# Patient Record
Sex: Female | Born: 1965 | Race: White | Hispanic: No | Marital: Married | Smoking: Never smoker
Health system: Southern US, Community
[De-identification: ages and names within clinical notes are randomized; demographics above are authoritative.]

## PROBLEM LIST (undated history)

## (undated) DIAGNOSIS — M5030 Other cervical disc degeneration, unspecified cervical region: Secondary | ICD-10-CM

## (undated) DIAGNOSIS — Z973 Presence of spectacles and contact lenses: Secondary | ICD-10-CM

## (undated) DIAGNOSIS — Z8742 Personal history of other diseases of the female genital tract: Secondary | ICD-10-CM

## (undated) DIAGNOSIS — T8859XA Other complications of anesthesia, initial encounter: Secondary | ICD-10-CM

## (undated) DIAGNOSIS — D649 Anemia, unspecified: Secondary | ICD-10-CM

## (undated) DIAGNOSIS — T4145XA Adverse effect of unspecified anesthetic, initial encounter: Secondary | ICD-10-CM

## (undated) DIAGNOSIS — F329 Major depressive disorder, single episode, unspecified: Secondary | ICD-10-CM

## (undated) DIAGNOSIS — R102 Pelvic and perineal pain: Secondary | ICD-10-CM

## (undated) DIAGNOSIS — G8929 Other chronic pain: Secondary | ICD-10-CM

## (undated) DIAGNOSIS — F32A Depression, unspecified: Secondary | ICD-10-CM

## (undated) DIAGNOSIS — M542 Cervicalgia: Secondary | ICD-10-CM

## (undated) HISTORY — PX: POSTERIOR LUMBAR FUSION: SHX6036

---
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
## 1989-07-28 HISTORY — PX: TONSILLECTOMY: SUR1361

## 1994-07-28 HISTORY — PX: VAGINAL HYSTERECTOMY: SUR661

## 2000-07-28 HISTORY — PX: ANTERIOR CERVICAL DECOMP/DISCECTOMY FUSION: SHX1161

## 2000-10-14 ENCOUNTER — Inpatient Hospital Stay (HOSPITAL_COMMUNITY): Admission: RE | Admit: 2000-10-14 | Discharge: 2000-10-15 | Payer: Self-pay | Admitting: Neurosurgery

## 2000-10-14 ENCOUNTER — Encounter: Payer: Self-pay | Admitting: Neurosurgery

## 2000-11-09 ENCOUNTER — Encounter: Admission: RE | Admit: 2000-11-09 | Discharge: 2000-11-09 | Payer: Self-pay | Admitting: Neurosurgery

## 2000-11-09 ENCOUNTER — Encounter: Payer: Self-pay | Admitting: Neurosurgery

## 2005-07-28 HISTORY — PX: COLONOSCOPY: SHX174

## 2005-09-05 ENCOUNTER — Ambulatory Visit: Payer: Self-pay | Admitting: Gastroenterology

## 2005-09-05 ENCOUNTER — Ambulatory Visit: Payer: Self-pay | Admitting: Internal Medicine

## 2005-09-09 ENCOUNTER — Ambulatory Visit: Payer: Self-pay | Admitting: Gastroenterology

## 2005-09-12 ENCOUNTER — Encounter: Payer: Self-pay | Admitting: Gastroenterology

## 2005-09-12 ENCOUNTER — Ambulatory Visit: Payer: Self-pay | Admitting: Gastroenterology

## 2005-10-10 ENCOUNTER — Ambulatory Visit: Payer: Self-pay | Admitting: Gastroenterology

## 2005-11-11 ENCOUNTER — Ambulatory Visit: Payer: Self-pay | Admitting: Gastroenterology

## 2010-04-23 ENCOUNTER — Encounter: Payer: Self-pay | Admitting: Gastroenterology

## 2010-04-26 ENCOUNTER — Encounter: Payer: Self-pay | Admitting: Gastroenterology

## 2010-05-08 ENCOUNTER — Encounter: Payer: Self-pay | Admitting: Gastroenterology

## 2010-05-14 ENCOUNTER — Encounter: Payer: Self-pay | Admitting: Gastroenterology

## 2010-08-30 ENCOUNTER — Encounter: Payer: Self-pay | Admitting: Gastroenterology

## 2010-09-02 ENCOUNTER — Encounter (INDEPENDENT_AMBULATORY_CARE_PROVIDER_SITE_OTHER): Payer: Self-pay | Admitting: *Deleted

## 2010-09-12 NOTE — Letter (Deleted)
Summary: New Patient letter  [facility]  [address]   [place], [place] [address]   Phone: [phone]  Fax: [phone]       09/02/2010 MRN: [id number]  [name] [name] [address] [place], [place]  [address]  Dear Ms. [name] [name],  Welcome to the Gastroenterology Division at [facility].    You are scheduled to see Dr.  PATTERSON on 10-01-10 at 8:30A.M. on the 3rd floor at [facility], [address].  We ask that you try to arrive at our office 15 minutes prior to your appointment time to allow for check-in.  We would like you to complete the enclosed self-administered evaluation form prior to your visit and bring it with you on the day of your appointment.  We will review it with you.  Also, please bring a complete list of all your medications or, if you prefer, bring the medication bottles and we will list them.  Please bring your insurance card so that we may make a copy of it.  If your insurance requires a referral to see a specialist, please bring your referral form from your primary care physician.  Co-payments are due at the time of your visit and may be paid by cash, check or credit card.     Your office visit will consist of a consult with your physician (includes a physical exam), any laboratory testing he/she may order, scheduling of any necessary diagnostic testing (e.g. x-ray, ultrasound, CT-scan), and scheduling of a procedure (e.g. Endoscopy, Colonoscopy) if required.  Please allow enough time on your schedule to allow for any/all of these possibilities.    If you cannot keep your appointment, please call [phone] to cancel or reschedule prior to your appointment date.  This allows us the opportunity to schedule an appointment for another patient in need of care.  If you do not cancel or reschedule by 5 p.m. the business day prior to your appointment date, you will be charged a $50.00 late cancellation/no-show fee.    Thank you for  choosing [facility] for your medical needs.  We appreciate the opportunity to care for you.  Please visit us at our website  to learn more about our practice.                     Sincerely,                                                             The Gastroenterology Division  

## 2010-09-30 DIAGNOSIS — Z8542 Personal history of malignant neoplasm of other parts of uterus: Secondary | ICD-10-CM | POA: Insufficient documentation

## 2010-09-30 DIAGNOSIS — Z8541 Personal history of malignant neoplasm of cervix uteri: Secondary | ICD-10-CM | POA: Insufficient documentation

## 2010-10-01 ENCOUNTER — Encounter: Payer: Self-pay | Admitting: Gastroenterology

## 2010-10-01 ENCOUNTER — Ambulatory Visit (INDEPENDENT_AMBULATORY_CARE_PROVIDER_SITE_OTHER): Payer: BC Managed Care – PPO | Admitting: Gastroenterology

## 2010-10-01 ENCOUNTER — Encounter (INDEPENDENT_AMBULATORY_CARE_PROVIDER_SITE_OTHER): Payer: Self-pay | Admitting: *Deleted

## 2010-10-01 ENCOUNTER — Other Ambulatory Visit: Payer: Self-pay | Admitting: Gastroenterology

## 2010-10-01 ENCOUNTER — Other Ambulatory Visit: Payer: BC Managed Care – PPO

## 2010-10-01 ENCOUNTER — Encounter: Payer: Self-pay | Admitting: *Deleted

## 2010-10-01 DIAGNOSIS — R1310 Dysphagia, unspecified: Secondary | ICD-10-CM | POA: Insufficient documentation

## 2010-10-01 DIAGNOSIS — R1013 Epigastric pain: Secondary | ICD-10-CM

## 2010-10-01 DIAGNOSIS — K219 Gastro-esophageal reflux disease without esophagitis: Secondary | ICD-10-CM

## 2010-10-01 LAB — HEPATIC FUNCTION PANEL
ALT: 16 U/L (ref 0–35)
AST: 18 U/L (ref 0–37)
Alkaline Phosphatase: 71 U/L (ref 39–117)
Bilirubin, Direct: 0.1 mg/dL (ref 0.0–0.3)
Total Bilirubin: 0.3 mg/dL (ref 0.3–1.2)

## 2010-10-01 LAB — BASIC METABOLIC PANEL
Chloride: 108 mEq/L (ref 96–112)
Potassium: 4.3 mEq/L (ref 3.5–5.1)

## 2010-10-01 LAB — CBC WITH DIFFERENTIAL/PLATELET
Basophils Absolute: 0 10*3/uL (ref 0.0–0.1)
Basophils Relative: 0.3 % (ref 0.0–3.0)
Eosinophils Absolute: 0.1 10*3/uL (ref 0.0–0.7)
Lymphocytes Relative: 21.7 % (ref 12.0–46.0)
MCHC: 34.2 g/dL (ref 30.0–36.0)
Neutrophils Relative %: 69.6 % (ref 43.0–77.0)
RBC: 3.96 Mil/uL (ref 3.87–5.11)
RDW: 14.5 % (ref 11.5–14.6)

## 2010-10-01 LAB — H. PYLORI ANTIBODY, IGG: H Pylori IgG: NEGATIVE

## 2010-10-01 LAB — IBC PANEL
Iron: 37 ug/dL — ABNORMAL LOW (ref 42–145)
Transferrin: 251.6 mg/dL (ref 212.0–360.0)

## 2010-10-01 LAB — AMYLASE: Amylase: 22 U/L — ABNORMAL LOW (ref 27–131)

## 2010-10-03 NOTE — Procedures (Deleted)
Summary: Endoscopy   EGD  Procedure date:  09/12/2005  Findings:      Location: [facility]   Patient Name: [name], [name] MRN:  Procedure Procedures: Panendoscopy (EGD) CPT: 43235.  Personnel: Endoscopist: David  R. Patterson, MD.  Exam Location: Exam performed in Outpatient Clinic. Outpatient  Patient Consent: Procedure, Alternatives, Risks and Benefits discussed, consent obtained, from patient. Consent was obtained by the RN.  Indications Symptoms: Chest Pain.  History  Current Medications: Patient is not currently taking Coumadin.  Pre-Exam Physical: Performed Sep 12, 2005  Cardio-pulmonary exam, Abdominal exam, Extremity exam, Mental status exam WNL.  Comments: Pt. history reviewed/updated, physical exam performed prior to initiation of sedation?yes Exam Exam Info: Maximum depth of insertion Duodenum, intended Duodenum. Patient position: on left side. Duration of exam: 15 minutes. Vocal cords visualized. Gastric retroflexion performed. Images taken. ASA Classification: I. Tolerance: excellent.  Sedation Meds: Patient assessed and found to be appropriate for moderate (conscious) sedation. Cetacaine Spray 2 sprays given aerosolized. Versed 2 mg. given IV.  Monitoring: BP and pulse monitoring done. Oximetry used. Supplemental O2 given at 2 Liters.  Instrument(s): GIF 160. Serial #[id number].   Findings - HIATAL HERNIA: Prolapsing, 5 cms. in length. ICD9: Hernia, Hiatal: 553.3. - Normal: Proximal Esophagus to Distal Esophagus. Not Seen: Tumor. Barrett's esophagus. Esophageal inflammation. Mucosal abnormality. Stricture. Varices.  - Normal: Fundus to Duodenal 2nd Portion. Ulcer. Mucosal abnormality. AVM's. Foreign body. Polyp.   Assessment  Diagnoses: 553.3: Hernia, Hiatal. ?? GERD as Dx. here.   Events  Unplanned Intervention: No unplanned interventions were required.  Plans Medication(s): PPI: Aciphex 20 mg BID, starting Sep 12, 2005  for 4 wks.   Patient Education: Patient given standard instructions for: Hiatal Hernia.  Disposition: After procedure patient sent to recovery. After recovery patient sent home.  Scheduling: Clinic Visit, to David  R. Patterson, MD, around Oct 10, 2005.    CC: Keith McManus, MD  This report was created from the original endoscopy report, which was reviewed and signed by the above listed endoscopist.  

## 2010-10-03 NOTE — Procedures (Deleted)
Summary: Colonoscopy   EGD  Procedure date:  09/12/2005  Findings:      Location: [facility]   Patient Name: [name] [name], [name] [name] MRN:  Procedure Procedures: Colonoscopy CPT: [address].  Personnel: Endoscopist: Vania Rea. Jarold Motto, MD.  Exam Location: Exam performed in Outpatient Clinic. Outpatient  Patient Consent: Procedure, Alternatives, Risks and Benefits discussed, consent obtained, from patient. Consent was obtained by the RN.  Indications  Surveillance of: Adenomatous Polyp(s).  Increased Risk Screening: Family History of Polyps.  History  Current Medications: Patient is not currently taking Coumadin.  Pre-Exam Physical: Performed Sep 12, 2005. Cardio-pulmonary exam, Rectal exam, Abdominal exam, Extremity exam, Mental status exam WNL.  Comments: Pt. history reviewed/updated, physical exam performed prior to initiation of sedation?yes Exam Exam: Extent of exam reached: Cecum, extent intended: Cecum.  The cecum was identified by appendiceal orifice and IC valve. Patient position: on left side. Duration of exam: 20 minutes. Colon retroflexion performed. Images taken. ASA Classification: I. Tolerance: excellent.  Monitoring: Pulse and BP monitoring, Oximetry used. Supplemental O2 given. at 2 Liters.  Colon Prep Used Golytely for colon prep. Prep results: excellent.  Sedation Meds: Patient assessed and found to be appropriate for moderate (conscious) sedation. Fentanyl 125 mcg. given IV. Versed 13 given IV.  Instrument(s): PCF 100. Serial [id number].  Findings - NORMAL EXAM: Cecum to Rectum. Not Seen: Polyps. AVM's. Colitis. Tumors. Melanosis. Crohn's. Diverticulosis. Hemorrhoids.   Assessment Normal examination.  Events  Unplanned Interventions: No intervention was required.  Plans Medication Plan: Continue current medications.  Patient Education: Patient given standard instructions for: a normal exam.  Disposition: After procedure patient  sent to recovery. After recovery patient sent home.  Scheduling/Referral: Follow-Up prn.    CC: Jeanmarie Plant, MD  This report was created from the original endoscopy report, which was reviewed and signed by the above listed endoscopist.

## 2010-10-08 NOTE — Letter (Signed)
Summary: Lois Huxley [facility]  Lois Huxley MD/[facility]   Imported By: Lester [place]in 10/04/2010 07:33:04  _____________________________________________________________________  External Attachment:    Type:   Image     Comment:   External Document

## 2010-10-08 NOTE — Letter (Deleted)
Summary: EGD Instructions  [facility]  [address] [place], [place] [address]   Phone: [phone]  Fax: [phone]       [name] [name]    1965/10/07    MRN: [id number]       Procedure Day /Date: Monday 10/14/2010     Arrival Time: 10:30am     Procedure Time: 11:30am     Location of Procedure:                    [facility] (4th Floor)   PREPARATION FOR ENDOSCOPY   On 10/14/2010 THE DAY OF THE PROCEDURE:  1.   No solid foods, milk or milk products are allowed after midnight the night before your procedure.  2.   Do not drink anything colored red or purple.  Avoid juices with pulp.  No orange juice.  3.  You may drink clear liquids until 9:30am, which is 2 hours before your procedure.                                                                                                CLEAR LIQUIDS INCLUDE: Water Jello Ice Popsicles Tea (sugar ok, no milk/cream) Powdered fruit flavored drinks Coffee (sugar ok, no milk/cream) Gatorade Juice: apple, white grape, white cranberry  Lemonade Clear bullion, consomm, broth Carbonated beverages (any kind) Strained chicken noodle soup Hard Candy   MEDICATION INSTRUCTIONS  Unless otherwise instructed, you should take regular prescription medications with a small sip of water as early as possible the morning of your procedure.                 OTHER INSTRUCTIONS  You will need a responsible adult at least 47 years of age to accompany you and drive you home.   This person must remain in the waiting room during your procedure.  Wear loose fitting clothing that is easily removed.  Leave jewelry and other valuables at home.  However, you may wish to bring a book to read or an iPod/MP3 player to listen to music as you wait for your procedure to start.  Remove all body piercing jewelry and leave at home.  Total time from sign-in until discharge is approximately 2-3 hours.  You should go home  directly after your procedure and rest.  You can resume normal activities the day after your procedure.  The day of your procedure you should not:   Drive   Make legal decisions   Operate machinery   Drink alcohol   Return to work  You will receive specific instructions about eating, activities and medications before you leave.    The above instructions have been reviewed and explained to me by   _______________________    I fully understand and can verbalize these instructions _____________________________ Date _________

## 2010-10-08 NOTE — Assessment & Plan Note (Deleted)
Summary: UPPER GASTRIC PAIN//SCH'D W/DAWN//BCBS INS//MEDLIST//CX POLIC...   History of Present Illness Visit Type: Initial Consult Primary GI MD: David Patterson MD FACP FAGA Primary Provider: James Davis, MD Requesting Provider: James Davis, MD Chief Complaint: Patient c/o several months epigastric abdominal pain which feels like "hunger pains." Feels better as long as food is on her stomach. She also notes some back pain which she wonders if associated with epigastric pain. Patient also c/o difficulty with food getting stuck in midsternal area.  History of Present Illness:   45-year-old Caucasian female with 5 months of epigastric non-discomfort radiating into her back relieved by small meals. She has nocturnal awakening and rather severe acid reflux symptoms with occasional solid food dysphagia. Previous endoscopy and colonoscopy in February of 2007 were unremarkable except for a 5 cm hiatal hernia.  She recently has had upper abdominal ultrasound exam and CCK HIDA scan without any abnormalities being determined. Lab data apparently has been unremarkable. She has not been on antacids, H2 blockers, or PPI medications. She does use large amount of Advil for migraine headaches. She denies melena, hematochezia, but does have nausea without emesis. There is no history of hepatitis, pancreatitis, or known liver disease.   GI Review of Systems    Reports abdominal pain and  nausea.     Location of  Abdominal pain: epigastric area.    Denies acid reflux, belching, bloating, chest pain, dysphagia with liquids, dysphagia with solids, heartburn, loss of appetite, vomiting, vomiting blood, weight loss, and  weight gain.      Reports rectal bleeding.     Denies anal fissure, black tarry stools, change in bowel habit, constipation, diarrhea, diverticulosis, fecal incontinence, heme positive stool, hemorrhoids, irritable bowel syndrome, jaundice, light color stool, liver problems, and  rectal  pain. Preventive Screening-Counseling & Management  Caffeine-Diet-Exercise     Does Patient Exercise: no      Drug Use:  no.      Current Medications (verified): 1)  Lortab 5-500 Mg Tabs (Hydrocodone-Acetaminophen) .... Take 1-2 Tabs By Mouth Every 4-6 Hours As Needed  Allergies: 1)  ! Codeine 2)  ! * Iodine Contrast  Past History:  Past medical, surgical, family and social histories (including risk factors) reviewed for relevance to current acute and chronic problems.  Past Medical History: Current Problems:  CERVICAL CANCER, HX OF (ICD-V10.41) ADENOCARCINOMA, UTERUS, HX OF (ICD-V10.42) Anemia Hx Adenomatous Polyp  Past Surgical History: Hysterectomy Tonsillectomy 3 spinal fusions  Family History: Reviewed history from 09/30/2010 and no changes required. Family History of Breast Cancer: paternal aunt  Family History Lung Cancer: Maternal aunt, Maternal grandmother Family History of Colitis: Sister Family History of Colon Polyps:Sister Family History of Diabetes: Cousins  Social History: Reviewed history from 09/30/2010 and no changes required. Patient has never smoked.  Alcohol Use - no Illicit Drug Use - no Alcohol Use - yes-rare Daily Caffeine Use Patient does not get regular exercise.  Does Patient Exercise:  no  Review of Systems       The patient complains of back pain, blood in urine, fatigue, muscle pains/cramps, sleeping problems, and urine leakage.    Vital Signs:  Patient profile:   44 year old female Height:      65 inches Weight:      203.13 pounds BMI:     33.92 BSA:     1.99 Pulse rate:   76 / minute Pulse rhythm:   regular BP sitting:   104 / 84  (right arm)  Vitals Entered   By: Dottie Nelson-Smith CMA (AAMA) (October 01, 2010 8:47 AM)  Physical Exam  General:  Well developed, well nourished, no acute distress.healthy appearing.   Head:  Normocephalic and atraumatic. Eyes:  PERRLA, no icterus.exam deferred to patient's  ophthalmologist.   Neck:  Supple; no masses or thyromegaly. Lungs:  Clear throughout to auscultation. Heart:  Regular rate and rhythm; no murmurs, rubs,  or bruits. Abdomen:  Soft, nontender and nondistended. No masses, hepatosplenomegaly or hernias noted. Normal bowel sounds. Msk:  Symmetrical with no gross deformities. Normal posture. Pulses:  Normal pulses noted. Extremities:  No clubbing, cyanosis, edema or deformities noted. Neurologic:  Alert and  oriented x4;  grossly normal neurologically. Cervical Nodes:  No significant cervical adenopathy. Psych:  Alert and cooperative. Normal mood and affect.   Impression & Recommendations:  Problem # 1:  EPIGASTRIC PAIN (ICD-789.06) Assessment Unchanged Probable NSAID-induced duodenal ulceration--rule out H. pylori. Labs have been ordered along with diagnostic endoscopy. I have started her on AcipHex 20 mg a day with standard ulcer dietary maneuvers. She does not need colonoscopy at this time. I suspect she may have a penetrating ulcer with their associated mid back pain. Gallbladder evaluation is been entirely normal. Orders: TLB-CBC Platelet - w/Differential (85025-CBCD) TLB-BMP (Basic Metabolic Panel-BMET) (80048-METABOL) TLB-Hepatic/Liver Function Pnl (80076-HEPATIC) TLB-TSH (Thyroid Stimulating Hormone) (84443-TSH) TLB-B12, Serum-Total ONLY (82607-B12) TLB-Ferritin (82728-FER) TLB-Folic Acid (Folate) (82746-FOL) TLB-IBC Pnl (Iron/FE;Transferrin) (83550-IBC) TLB-H. Pylori Abs(Helicobacter Pylori) (86677-HELICO) TLB-Amylase (82150-AMYL) TLB-Lipase (83690-LIPASE) EGD (EGD)  Problem # 2:  DYSPHAGIA UNSPECIFIED (ICD-787.20) Assessment: Deteriorated Probable chronic GERD and peptic stricture versus office associated with previously documented 5 cm hilar hernia. The time of endoscopy will probably need to do esophageal dilatation. Risk and benefits of this procedure have been explained in detail, and she is agreed to proceed as  planned. Orders: TLB-CBC Platelet - w/Differential (85025-CBCD) TLB-BMP (Basic Metabolic Panel-BMET) (80048-METABOL) TLB-Hepatic/Liver Function Pnl (80076-HEPATIC) TLB-TSH (Thyroid Stimulating Hormone) (84443-TSH) TLB-B12, Serum-Total ONLY (82607-B12) TLB-Ferritin (82728-FER) TLB-Folic Acid (Folate) (82746-FOL) TLB-IBC Pnl (Iron/FE;Transferrin) (83550-IBC) TLB-H. Pylori Abs(Helicobacter Pylori) (86677-HELICO) TLB-Amylase (82150-AMYL) TLB-Lipase (83690-LIPASE) EGD (EGD)  Problem # 3:  CERVICAL CANCER, HX OF (ICD-V10.41) Assessment: Comment Only  Problem # 4:  ADENOCARCINOMA, UTERUS, HX OF (ICD-V10.42) Assessment: Comment Only  Patient Instructions: 1)  Copy sent to : James Davis, MD 2)   Endoscopy Center Patient Information Guide given to patient.  3)  Upper Endoscopy brochure given.  4)  Your procedure has been scheduled for 10/14/2010, please follow the seperate instructions.  5)  Your prescription(s) have been sent to you pharmacy.  6)  Avoid foods high in acid content ( tomatoes, citrus juices, spicy foods) . Avoid eating within 3 to 4 hours of lying down or before exercising. Do not over eat; try smaller more frequent meals. Elevate head of bed four inches when sleeping.  7)  GI Reflux brochure given.  8)  Please go to the basement today for your labs.  9)  The medication list was reviewed and reconciled.  All changed / newly prescribed medications were explained.  A complete medication list was provided to the patient / caregiver. Prescriptions: TRAMADOL HCL 50 MG TABS (TRAMADOL HCL) take one by mouth 6-8 hours  #90 x 0   Entered by:   Leisha Kowalk CMA (AAMA)   Authorized by:   David R Patterson MD FACG   Signed by:   David R Patterson MD FACG on 10/01/2010   Method used:   Electronically to        [facility]* (retail)       [address]       [place], [place]  [address]       Ph: [phone]       Fax: [phone]   RxID:   [id number] ACIPHEX 20 MG  TBEC (RABEPRAZOLE SODIUM) take one by mouth once daily  #30 x 6   Entered by:   Leisha Kowalk CMA (AAMA)   Authorized by:   David R Patterson MD FACG   Signed by:   Leisha Kowalk CMA (AAMA) on 10/01/2010   Method used:   Electronically to        [facility]* (retail)       [address]       [place], [place]  [address]       Ph: [phone]       Fax: [phone]   RxID:   [id number] MOVIPREP 100 GM  SOLR (PEG-KCL-NACL-NASULF-NA ASC-C) As per prep instructions.  #1 x 0   Entered by:   Leisha Kowalk CMA (AAMA)   Authorized by:   David R Patterson MD FACG   Signed by:   Leisha Kowalk CMA (AAMA) on 10/01/2010   Method used:   Electronically to        [facility]* (retail)       [address]       [place], [place]  [address]       Ph: [phone]       Fax: [phone]   RxID:   [id number]  

## 2010-10-08 NOTE — Letter (Signed)
Summary: [facility]is Huxley [facility]  Lois Huxley MD/[facility]   Imported By: Lester [place] 10/04/2010 07:25:55  _____________________________________________________________________  External Attachment:    Type:   Image     Comment:   External Document

## 2010-10-10 ENCOUNTER — Telehealth: Payer: Self-pay | Admitting: Gastroenterology

## 2010-10-11 ENCOUNTER — Telehealth: Payer: Self-pay | Admitting: *Deleted

## 2010-10-14 ENCOUNTER — Encounter: Payer: Self-pay | Admitting: Gastroenterology

## 2010-10-14 ENCOUNTER — Other Ambulatory Visit (AMBULATORY_SURGERY_CENTER): Payer: BC Managed Care – PPO | Admitting: Gastroenterology

## 2010-10-14 ENCOUNTER — Other Ambulatory Visit (INDEPENDENT_AMBULATORY_CARE_PROVIDER_SITE_OTHER): Payer: BC Managed Care – PPO | Admitting: Gastroenterology

## 2010-10-14 ENCOUNTER — Other Ambulatory Visit: Payer: Self-pay | Admitting: Gastroenterology

## 2010-10-14 DIAGNOSIS — R12 Heartburn: Secondary | ICD-10-CM

## 2010-10-14 DIAGNOSIS — R1013 Epigastric pain: Secondary | ICD-10-CM

## 2010-10-14 DIAGNOSIS — R109 Unspecified abdominal pain: Secondary | ICD-10-CM

## 2010-10-14 DIAGNOSIS — R110 Nausea: Secondary | ICD-10-CM

## 2010-10-15 LAB — HELICOBACTER PYLORI SCREEN-BIOPSY: UREASE: NEGATIVE

## 2010-10-15 NOTE — Progress Notes (Signed)
Summary: triage  Phone Note Call from Patient Call back at Home Phone [phone]   Caller: Patient Call For: Dr Jarold Motto Reason for Call: Talk to Nurse Summary of Call: Patient having allergy to meds given to her so she stopped taking them and now she's having trouble with her stomach. Initial call taken by: Tawni Levy,  October 10, 2010 9:41 AM  Follow-up for Phone Call        [facility] for patient to call back. Graciella Freer RN  October 10, 2010 3:19 PM    Had a message from patient leaving me her cell number [phone]. Lm on cell # for her to call back. Follow-up by: Graciella Freer RN,  October 11, 2010 9:25 AM  Additional Follow-up for Phone Call Additional follow up Details #1::        Patient has 2 charts; please see notes in other chart Additional Follow-up by: Graciella Freer RN,  October 11, 2010 9:26 AM

## 2010-10-15 NOTE — Progress Notes (Deleted)
  Phone Note Call from Patient Call back at [phone]   Caller: Patient Call For: TRIAGE Reason for Call: Acute Illness Details of Complaint: ALLERGIC TO MED Summary of Call: Patient reports she saw Dr Patterson on 10/01/10 and was placed on Aciphex and Ultram. She started itching on 10/04/10 and then on 10/06/10 she broke out with a red raised rash on her face. She stopped the Ultram on that day and stopped the Aciphex 10/08/10. Patient stated her face is clearing up, but she has stomach pain and she's nauseous d/t increased acid. Patient has an EGD on 10/14/10 to r/o ulcer and/or H. PYlori. Please advise. Initial call taken by: Beau Vanduzer RN,  October 11, 2010 9:39 AM  Follow-up for Phone Call        WHY DONT WE GIVE HER SOME SAMPLES OF NEXIUM 40 MG DAILY IN PLACE OF ACIPHEX.. I SUSPECT ULTRAM MORE LIKELY TO CAUSE ALLERGIC SXS-NO MORE OF THAT....WOULD JUST USE TYLENOL FOR PAIN Follow-up by: Amy S Esterwood PA-c,  October 11, 2010 10:05 AM  Additional Follow-up for Phone Call Additional follow up Details #1::        Explained to patient Amy Esterwood PAC would like to give her Nexium to help her reflux and pain. She thinks  the Ultram caused the reaction. She will take Tylenol for pain and call for further problems until 10/14/10 when she has her EGD. Patient will pick up her samples today. Additional Follow-up by: Anav Lammert RN,  October 11, 2010 10:49 AM    New/Updated Medications: NEXIUM 40 MG  CPDR (ESOMEPRAZOLE MAGNESIUM) 1 capsule each day 30 minutes before meal Prescriptions: NEXIUM 40 MG  CPDR (ESOMEPRAZOLE MAGNESIUM) 1 capsule each day 30 minutes before meal  #15 x 0   Entered by:   Jaysten Essner RN   Authorized by:   Amy S Esterwood PA-c   Signed by:   Festus Pursel RN on 10/11/2010   Method used:   Samples Given   RxID:   [id number]  

## 2010-10-18 ENCOUNTER — Encounter: Payer: Self-pay | Admitting: Gastroenterology

## 2010-10-24 NOTE — Procedures (Signed)
Summary: Upper Endoscopy  Patient: [name] [name] Note: All result statuses are Final unless otherwise noted.  Tests: (1) Upper Endoscopy (EGD)   EGD Upper Endoscopy       DONE     [facility]     [address]     [place], [place]  [address]          ENDOSCOPY PROCEDURE REPORT          PATIENT:  [name] [name], [name] [name]  MR#:  [id number]     BIRTHDATE:  August 24, 1965, 44 yrs. old  GENDER:  female          ENDOSCOPIST:  Vania Rea. Jarold Motto, MD, [facility]     Referred by:          PROCEDURE DATE:  10/14/2010     PROCEDURE:  EGD with biopsy for H. pylori [address]     ASA CLASS:  Class I     INDICATIONS:  abdominal pain despite treatment          MEDICATIONS:   Fentanyl 75 mcg IV, Versed 7 mg IV     TOPICAL ANESTHETIC:  Exactacain Spray          DESCRIPTION OF PROCEDURE:   After the risks benefits and     alternatives of the procedure were thoroughly explained, informed     consent was obtained.  The LB GIF-H180 [id number] endoscope was     introduced through the mouth and advanced to the second portion of     the duodenum, without limitations.  The instrument was slowly     withdrawn as the mucosa was fully examined.     <<PROCEDUREIMAGES>>          The upper, middle, and distal third of the esophagus were     carefully inspected and no abnormalities were noted. The z-line     was well seen at the GEJ. The endoscope was pushed into the fundus     which was normal including a retroflexed view. The antrum,gastric     body, first and second part of the duodenum were unremarkable.     DUODENAL AND ESOPHAGEAL BIOPSIES DONE.    Retroflexed views     revealed no abnormalities.    The scope was then withdrawn from     the patient and the procedure completed.          COMPLICATIONS:  None          ENDOSCOPIC IMPRESSION:     1) Normal EGD     NO CAUSE FOR PAIN.BIOPSIES DONE.PROBABLE IBS.     RECOMMENDATIONS:     1) Await biopsy results     1.TRIAL OF CARAFATE SUSPENSION PC AND QHS.STOP  PPI RX.CANNOT     TOLERATE.     2.F/U PRIMARY CARE.          REPEAT EXAM:  No          ______________________________     Vania Rea. Jarold Motto, MD, [facility]          CC:          n.     eSIGNED:   Vania Rea. Christionna Poland at 10/14/2010 12:11 PM          Rusty Aus, [id number]  Note: An exclamation mark (!) indicates a result that was not dispersed into the flowsheet. Document Creation Date: 10/14/2010 12:11 PM _______________________________________________________________________  (1) Order result status: Final Collection or observation date-time: 10/14/2010 12:02 Requested date-time:  Receipt date-time:  Reported  date-time:  Referring Physician:   Ordering Physician: Sheryn Bison [phone]) Specimen Source:  Source: Launa Grill Order Number: [id number] Lab site:

## 2010-10-24 NOTE — Assessment & Plan Note (Signed)
Summary: UPPER GASTRIC PAIN//SCH'D W/DAWN//BCBS INS//MEDLIST//CX POLIC...   History of Present Illness Visit Type: Initial Consult Primary GI MD: Sheryn Bison MD FACP FAGA Primary Provider: Lois Huxley, MD Requesting Provider: Lois Huxley, MD Chief Complaint: Patient c/o several months epigastric abdominal pain which feels like "hunger pains." Feels better as long as food is on her stomach. She also notes some back pain which she wonders if associated with epigastric pain. Patient also c/o difficulty with food getting stuck in midsternal area.  History of Present Illness:   45 year old Caucasian female with 5 months of epigastric non-discomfort radiating into her back relieved by small meals. She has nocturnal awakening and rather severe acid reflux symptoms with occasional solid food dysphagia. Previous endoscopy and colonoscopy in February of 2007 were unremarkable except for a 5 cm hiatal hernia.  She recently has had upper abdominal ultrasound exam and CCK HIDA scan without any abnormalities being determined. Lab data apparently has been unremarkable. She has not been on antacids, H2 blockers, or PPI medications. She does use large amount of Advil for migraine headaches. She denies melena, hematochezia, but does have nausea without emesis. There is no history of hepatitis, pancreatitis, or known liver disease.   GI Review of Systems    Reports abdominal pain and  nausea.     Location of  Abdominal pain: epigastric area.    Denies acid reflux, belching, bloating, chest pain, dysphagia with liquids, dysphagia with solids, heartburn, loss of appetite, vomiting, vomiting blood, weight loss, and  weight gain.      Reports rectal bleeding.     Denies anal fissure, black tarry stools, change in bowel habit, constipation, diarrhea, diverticulosis, fecal incontinence, heme positive stool, hemorrhoids, irritable bowel syndrome, jaundice, light color stool, liver problems, and  rectal  pain. Preventive Screening-Counseling & Management  Caffeine-Diet-Exercise     Does Patient Exercise: no      Drug Use:  no.      Current Medications (verified): 1)  Lortab 5-500 Mg Tabs (Hydrocodone-Acetaminophen) .... Take 1-2 Tabs By Mouth Every 4-6 Hours As Needed  Allergies: 1)  ! Codeine 2)  ! * Iodine Contrast  Past History:  Past medical, surgical, family and social histories (including risk factors) reviewed for relevance to current acute and chronic problems.  Past Medical History: Current Problems:  CERVICAL CANCER, HX OF (ICD-V10.41) ADENOCARCINOMA, UTERUS, HX OF (ICD-V10.42) Anemia Hx Adenomatous Polyp  Past Surgical History: Hysterectomy Tonsillectomy 3 spinal fusions  Family History: Reviewed history from 09/30/2010 and no changes required. Family History of Breast Cancer: paternal aunt  Family History Lung Cancer: Maternal aunt, Maternal grandmother Family History of Colitis: Sister Family History of Colon Polyps:Sister Family History of Diabetes: Cousins  Social History: Reviewed history from 09/30/2010 and no changes required. Patient has never smoked.  Alcohol Use - no Illicit Drug Use - no Alcohol Use - yes-rare Daily Caffeine Use Patient does not get regular exercise.  Does Patient Exercise:  no  Review of Systems       The patient complains of back pain, blood in urine, fatigue, muscle pains/cramps, sleeping problems, and urine leakage.    Vital Signs:  Patient profile:   45 year old female Height:      65 inches Weight:      203.13 pounds BMI:     33.92 BSA:     1.99 Pulse rate:   76 / minute Pulse rhythm:   regular BP sitting:   104 / 84  (right arm)  Vitals Entered  By: Lamona Curl CMA Duncan Dull) (October 01, 2010 8:47 AM)  Physical Exam  General:  Well developed, well nourished, no acute distress.healthy appearing.   Head:  Normocephalic and atraumatic. Eyes:  PERRLA, no icterus.exam deferred to patient's  ophthalmologist.   Neck:  Supple; no masses or thyromegaly. Lungs:  Clear throughout to auscultation. Heart:  Regular rate and rhythm; no murmurs, rubs,  or bruits. Abdomen:  Soft, nontender and nondistended. No masses, hepatosplenomegaly or hernias noted. Normal bowel sounds. Msk:  Symmetrical with no gross deformities. Normal posture. Pulses:  Normal pulses noted. Extremities:  No clubbing, cyanosis, edema or deformities noted. Neurologic:  Alert and  oriented x4;  grossly normal neurologically. Cervical Nodes:  No significant cervical adenopathy. Psych:  Alert and cooperative. Normal mood and affect.   Impression & Recommendations:  Problem # 1:  EPIGASTRIC PAIN (ICD-789.06) Assessment Unchanged Probable NSAID-induced duodenal ulceration--rule out H. pylori. Labs have been ordered along with diagnostic endoscopy. I have started her on AcipHex 20 mg a day with standard ulcer dietary maneuvers. She does not need colonoscopy at this time. I suspect she may have a penetrating ulcer with their associated mid back pain. Gallbladder evaluation is been entirely normal. Orders: TLB-CBC Platelet - w/Differential (85025-CBCD) TLB-BMP (Basic Metabolic Panel-BMET) (80048-METABOL) TLB-Hepatic/Liver Function Pnl (80076-HEPATIC) TLB-TSH (Thyroid Stimulating Hormone) (84443-TSH) TLB-B12, Serum-Total ONLY (04540-J81) TLB-Ferritin (82728-FER) TLB-Folic Acid (Folate) (82746-FOL) TLB-IBC Pnl (Iron/FE;Transferrin) (83550-IBC) TLB-H. Pylori Abs(Helicobacter Pylori) (86677-HELICO) TLB-Amylase (82150-AMYL) TLB-Lipase (83690-LIPASE) EGD (EGD)  Problem # 2:  DYSPHAGIA UNSPECIFIED (ICD-787.20) Assessment: Deteriorated Probable chronic GERD and peptic stricture versus office associated with previously documented 5 cm hilar hernia. The time of endoscopy will probably need to do esophageal dilatation. Risk and benefits of this procedure have been explained in detail, and she is agreed to proceed as  planned. Orders: TLB-CBC Platelet - w/Differential (85025-CBCD) TLB-BMP (Basic Metabolic Panel-BMET) (80048-METABOL) TLB-Hepatic/Liver Function Pnl (80076-HEPATIC) TLB-TSH (Thyroid Stimulating Hormone) (84443-TSH) TLB-B12, Serum-Total ONLY (19147-W29) TLB-Ferritin (82728-FER) TLB-Folic Acid (Folate) (82746-FOL) TLB-IBC Pnl (Iron/FE;Transferrin) (83550-IBC) TLB-H. Pylori Abs(Helicobacter Pylori) (86677-HELICO) TLB-Amylase (82150-AMYL) TLB-Lipase (83690-LIPASE) EGD (EGD)  Problem # 3:  CERVICAL CANCER, HX OF (ICD-V10.41) Assessment: Comment Only  Problem # 4:  ADENOCARCINOMA, UTERUS, HX OF (ICD-V10.42) Assessment: Comment Only  Patient Instructions: 1)  Copy sent to : Lois Huxley, MD 2)  [facility] Patient Information Guide given to patient.  3)  Upper Endoscopy brochure given.  4)  Your procedure has been scheduled for 10/14/2010, please follow the seperate instructions.  5)  Your prescription(s) have been sent to you pharmacy.  6)  Avoid foods high in acid content ( tomatoes, citrus juices, spicy foods) . Avoid eating within 3 to 4 hours of lying down or before exercising. Do not over eat; try smaller more frequent meals. Elevate head of bed four inches when sleeping.  7)  GI Reflux brochure given.  8)  Please go to the basement today for your labs.  9)  The medication list was reviewed and reconciled.  All changed / newly prescribed medications were explained.  A complete medication list was provided to the patient / caregiver. Prescriptions: TRAMADOL HCL 50 MG TABS (TRAMADOL HCL) take one by mouth 6-8 hours  #90 x 0   Entered by:   Harlow Mares CMA (AAMA)   Authorized by:   Mardella Layman MD [facility]   Signed by:   Mardella Layman MD [facility] on 10/01/2010   Method used:   Electronically to        [facility]  [address] [facility]* (retail)       [address]       [place], [place]  [address]       Ph: [phone]       Fax: [phone]   RxID:   [id number] ACIPHEX 20 MG  TBEC (RABEPRAZOLE SODIUM) take one by mouth once daily  #30 x 6   Entered by:   Harlow Mares CMA (AAMA)   Authorized by:   Mardella Layman MD [facility]   Signed by:   Harlow Mares CMA (AAMA) on 10/01/2010   Method used:   Electronically to        [facility]* (retail)       [address]       [place], [place]  [address]       Ph: [phone]       Fax: [phone]   RxID:   [id number] MOVIPREP 100 GM  SOLR (PEG-KCL-NACL-NASULF-NA ASC-C) As per prep instructions.  #1 x 0   Entered by:   Harlow Mares CMA (AAMA)   Authorized by:   Mardella Layman MD [facility]   Signed by:   Harlow Mares CMA (AAMA) on 10/01/2010   Method used:   Electronically to        [facility]* (retail)       [address]       [place], [place]  [address]       Ph: [phone]       Fax: [phone]   RxID:   [id number]

## 2010-10-24 NOTE — Miscellaneous (Signed)
Summary: Orders Update  Clinical Lists Changes  Orders: Added new Test order of TLB-H Pylori Screen Gastric Biopsy (83013-CLOTEST) - Signed 

## 2010-10-24 NOTE — Progress Notes (Signed)
  Phone Note Call from Patient Call back at [phone]   Caller: Patient Call For: TRIAGE Reason for Call: Acute Illness Details of Complaint: ALLERGIC TO MED Summary of Call: Patient reports she saw Dr Jarold Motto on 10/01/10 and was placed on Aciphex and Ultram. She started itching on 10/04/10 and then on 10/06/10 she broke out with a red raised rash on her face. She stopped the Ultram on that day and stopped the Aciphex 10/08/10. Patient stated her face is clearing up, but she has stomach pain and she's nauseous d/t increased acid. Patient has an EGD on 10/14/10 to r/o ulcer and/or H. PYlori. Please advise. Initial call taken by: Graciella Freer RN,  October 11, 2010 9:39 AM  Follow-up for Phone Call        WHY DONT WE GIVE HER SOME SAMPLES OF NEXIUM 40 MG DAILY IN PLACE OF ACIPHEX.Charline Bills SUSPECT ULTRAM MORE LIKELY TO CAUSE ALLERGIC SXS-NO MORE OF THAT....WOULD JUST USE TYLENOL FOR PAIN Follow-up by: Peterson Ao,  October 11, 2010 10:05 AM  Additional Follow-up for Phone Call Additional follow up Details #1::        Explained to patient [facility] would like to give her Nexium to help her reflux and pain. She thinks  the Ultram caused the reaction. She will take Tylenol for pain and call for further problems until 10/14/10 when she has her EGD. Patient will pick up her samples today. Additional Follow-up by: Graciella Freer RN,  October 11, 2010 10:49 AM    New/Updated Medications: NEXIUM 40 MG  CPDR (ESOMEPRAZOLE MAGNESIUM) 1 capsule each day 30 minutes before meal Prescriptions: NEXIUM 40 MG  CPDR (ESOMEPRAZOLE MAGNESIUM) 1 capsule each day 30 minutes before meal  #15 x 0   Entered by:   Graciella Freer RN   Authorized by:   Sammuel Cooper PA-c   Signed by:   Graciella Freer RN on 10/11/2010   Method used:   Samples Given   RxID:   [id number]

## 2010-10-24 NOTE — Procedures (Signed)
Summary: Colonoscopy   EGD  Procedure date:  09/12/2005  Findings:      Location: [facility]   Patient Name: [name], [name] MRN:  Procedure Procedures: Colonoscopy CPT: 45378.  Personnel: Endoscopist: Gianmarco Roye  R. Aliegha Paullin, MD.  Exam Location: Exam performed in Outpatient Clinic. Outpatient  Patient Consent: Procedure, Alternatives, Risks and Benefits discussed, consent obtained, from patient. Consent was obtained by the RN.  Indications  Surveillance of: Adenomatous Polyp(s).  Increased Risk Screening: Family History of Polyps.  History  Current Medications: Patient is not currently taking Coumadin.  Pre-Exam Physical: Performed Sep 12, 2005. Cardio-pulmonary exam, Rectal exam, Abdominal exam, Extremity exam, Mental status exam WNL.  Comments: Pt. history reviewed/updated, physical exam performed prior to initiation of sedation?yes Exam Exam: Extent of exam reached: Cecum, extent intended: Cecum.  The cecum was identified by appendiceal orifice and IC valve. Patient position: on left side. Duration of exam: 20 minutes. Colon retroflexion performed. Images taken. ASA Classification: I. Tolerance: excellent.  Monitoring: Pulse and BP monitoring, Oximetry used. Supplemental O2 given. at 2 Liters.  Colon Prep Used Golytely for colon prep. Prep results: excellent.  Sedation Meds: Patient assessed and found to be appropriate for moderate (conscious) sedation. Fentanyl 125 mcg. given IV. Versed 13 given IV.  Instrument(s): PCF 100. Serial #[id number].  Findings - NORMAL EXAM: Cecum to Rectum. Not Seen: Polyps. AVM's. Colitis. Tumors. Melanosis. Crohn's. Diverticulosis. Hemorrhoids.   Assessment Normal examination.  Events  Unplanned Interventions: No intervention was required.  Plans Medication Plan: Continue current medications.  Patient Education: Patient given standard instructions for: a normal exam.  Disposition: After procedure patient  sent to recovery. After recovery patient sent home.  Scheduling/Referral: Follow-Up prn.    CC: Keith McManus, MD  This report was created from the original endoscopy report, which was reviewed and signed by the above listed endoscopist.  

## 2010-10-24 NOTE — Miscellaneous (Signed)
Summary: carafate order  Clinical Lists Changes  Medications: Added new medication of CARAFATE 1 GM/10ML  SUSP (SUCRALFATE) carafate supension pc and at bedtime . stop ppi rx cannot tolerate - Signed Rx of CARAFATE 1 GM/10ML  SUSP (SUCRALFATE) carafate supension pc and at bedtime . stop ppi rx cannot tolerate;  #1 x 0;  Signed;  Entered by: Alease Frame RN;  Authorized by: Mardella Layman MD [facility];  Method used: Electronically to [facility] [address] [place], [place]  [address], Ph: [phone], Fax: [phone] Observations: Added new observation of ALLERGY REV: Done (10/14/2010 12:29)    Prescriptions: CARAFATE 1 GM/10ML  SUSP (SUCRALFATE) carafate supension pc and at bedtime . stop ppi rx cannot tolerate  #1 x 0   Entered by:   Alease Frame RN   Authorized by:   Mardella Layman MD [facility]   Signed by:   Alease Frame RN on 10/14/2010   Method used:   Electronically to        [facility]* (retail)       [address]       [place], [place]  [address]       Ph: [phone]       Fax: [phone]   RxID:   [id number]

## 2010-10-24 NOTE — Letter (Signed)
Summary: New Patient letter  [facility]  [address] [place], [place] [address]   Phone: [phone]  Fax: [phone]       09/02/2010 MRN: [id number]  [facility] [address] [place], [place]  [address]  Dear Ms. Kops,  Welcome to the Gastroenterology Division at [facility].    You are scheduled to see Dr.  Jarold Motto on 10-01-10 at 8:30A.M. on the 3rd floor at [facility], [address]  We ask that you try to arrive at our office 15 minutes prior to your appointment time to allow for check-in.  We would like you to complete the enclosed self-administered evaluation form prior to your visit and bring it with you on the day of your appointment.  We will review it with you.  Also, please bring a complete list of all your medications or, if you prefer, bring the medication bottles and we will list them.  Please bring your insurance card so that we may make a copy of it.  If your insurance requires a referral to see a specialist, please bring your referral form from your primary care physician.  Co-payments are due at the time of your visit and may be paid by cash, check or credit card.     Your office visit will consist of a consult with your physician (includes a physical exam), any laboratory testing he/she may order, scheduling of any necessary diagnostic testing (e.g. x-ray, ultrasound, CT-scan), and scheduling of a procedure (e.g. Endoscopy, Colonoscopy) if required.  Please allow enough time on your schedule to allow for any/all of these possibilities.    If you cannot keep your appointment, please call [phone] to cancel or reschedule prior to your appointment date.  This allows [place] the opportunity to schedule an appointment for another patient in need of care.  If you do not cancel or reschedule by 5 p.m. the business day prior to your appointment date, you will be charged a $50.00 late cancellation/no-show fee.    Thank you for  choosing [facility] for your medical needs.  We appreciate the opportunity to care for you.  Please visit [place] at our website  to learn more about our practice.                     Sincerely,                                                             The Gastroenterology Division

## 2010-10-24 NOTE — Letter (Signed)
Summary: EGD Instructions  [facility]  [address]   [place], [place] [address]   Phone: [phone]  Fax: [phone]       [name] [name]    08/18/1965    MRN: [id number]       Procedure Day /Date: Monday 10/14/2010     Arrival Time: 10:30am     Procedure Time: 11:30am     Location of Procedure:                    [facility] (4th Floor)   PREPARATION FOR ENDOSCOPY   On 10/14/2010 THE DAY OF THE PROCEDURE:  1.   No solid foods, milk or milk products are allowed after midnight the night before your procedure.  2.   Do not drink anything colored red or purple.  Avoid juices with pulp.  No orange juice.  3.  You may drink clear liquids until 9:30am, which is 2 hours before your procedure.                                                                                                CLEAR LIQUIDS INCLUDE: Water Jello Ice Popsicles Tea (sugar ok, no milk/cream) Powdered fruit flavored drinks Coffee (sugar ok, no milk/cream) Gatorade Juice: apple, white grape, white cranberry  Lemonade Clear bullion, consomm, broth Carbonated beverages (any kind) Strained chicken noodle soup Hard Candy   MEDICATION INSTRUCTIONS  Unless otherwise instructed, you should take regular prescription medications with a small sip of water as early as possible the morning of your procedure.                 OTHER INSTRUCTIONS  You will need a responsible adult at least 47 years of age to accompany you and drive you home.   This person must remain in the waiting room during your procedure.  Wear loose fitting clothing that is easily removed.  Leave jewelry and other valuables at home.  However, you may wish to bring a book to read or an iPod/MP3 player to listen to music as you wait for your procedure to start.  Remove all body piercing jewelry and leave at home.  Total time from sign-in until discharge is approximately 2-3 hours.  You should go home  directly after your procedure and rest.  You can resume normal activities the day after your procedure.  The day of your procedure you should not:   Drive   Make legal decisions   Operate machinery   Drink alcohol   Return to work  You will receive specific instructions about eating, activities and medications before you leave.    The above instructions have been reviewed and explained to me by   _______________________    I fully understand and can verbalize these instructions _____________________________ Date _________    

## 2010-10-24 NOTE — Procedures (Signed)
Summary: Endoscopy   EGD  Procedure date:  09/12/2005  Findings:      Location: [facility]   Patient Name: [name], [name] MRN:  Procedure Procedures: Panendoscopy (EGD) CPT: 43235.  Personnel: Endoscopist: Vania Rea. Jarold Motto, MD.  Exam Location: Exam performed in Outpatient Clinic. Outpatient  Patient Consent: Procedure, Alternatives, Risks and Benefits discussed, consent obtained, from patient. Consent was obtained by the RN.  Indications Symptoms: Chest Pain.  History  Current Medications: Patient is not currently taking Coumadin.  Pre-Exam Physical: Performed Sep 12, 2005  Cardio-pulmonary exam, Abdominal exam, Extremity exam, Mental status exam WNL.  Comments: Pt. history reviewed/updated, physical exam performed prior to initiation of sedation?yes Exam Exam Info: Maximum depth of insertion Duodenum, intended Duodenum. Patient position: on left side. Duration of exam: 15 minutes. Vocal cords visualized. Gastric retroflexion performed. Images taken. ASA Classification: I. Tolerance: excellent.  Sedation Meds: Patient assessed and found to be appropriate for moderate (conscious) sedation. Cetacaine Spray 2 sprays given aerosolized. Versed 2 mg. given IV.  Monitoring: BP and pulse monitoring done. Oximetry used. Supplemental O2 given at 2 Liters.  Instrument(s): GIF 160. Serial [id number].   Findings - HIATAL HERNIA: Prolapsing, 5 cms. in length. ICD9: Hernia, Hiatal: 553.3. - Normal: Proximal Esophagus to Distal Esophagus. Not Seen: Tumor. Barrett's esophagus. Esophageal inflammation. Mucosal abnormality. Stricture. Varices.  - Normal: Fundus to Duodenal 2nd Portion. Ulcer. Mucosal abnormality. AVM's. Foreign body. Polyp.   Assessment  Diagnoses: 553.3: Hernia, Hiatal. ?? GERD as Dx. here.   Events  Unplanned Intervention: No unplanned interventions were required.  Plans Medication(s): PPI: Aciphex 20 mg BID, starting Sep 12, 2005  for 4 wks.   Patient Education: Patient given standard instructions for: Hiatal Hernia.  Disposition: After procedure patient sent to recovery. After recovery patient sent home.  Scheduling: Clinic Visit, to Vania Rea. Jarold Motto, MD, around Oct 10, 2005.    CC: Jeanmarie Plant, MD  This report was created from the original endoscopy report, which was reviewed and signed by the above listed endoscopist.

## 2014-01-14 ENCOUNTER — Emergency Department (HOSPITAL_COMMUNITY)
Admission: EM | Admit: 2014-01-14 | Discharge: 2014-01-14 | Disposition: A | Discharge status: Home / Self Care | Payer: BC Managed Care – PPO | Attending: Emergency Medicine | Admitting: Emergency Medicine

## 2014-01-14 ENCOUNTER — Encounter (HOSPITAL_COMMUNITY): Payer: Self-pay | Admitting: Emergency Medicine

## 2014-01-14 DIAGNOSIS — M545 Low back pain, unspecified: Secondary | ICD-10-CM | POA: Insufficient documentation

## 2014-01-14 DIAGNOSIS — Z9889 Other specified postprocedural states: Secondary | ICD-10-CM | POA: Insufficient documentation

## 2014-01-14 LAB — COMPREHENSIVE METABOLIC PANEL
ALBUMIN: 3.7 g/dL (ref 3.5–5.2)
ALK PHOS: 112 U/L (ref 39–117)
ALT: 44 U/L — ABNORMAL HIGH (ref 0–35)
AST: 51 U/L — ABNORMAL HIGH (ref 0–37)
BILIRUBIN TOTAL: 0.4 mg/dL (ref 0.3–1.2)
BUN: 10 mg/dL (ref 6–23)
CHLORIDE: 96 meq/L (ref 96–112)
CO2: 26 mEq/L (ref 19–32)
Calcium: 9.6 mg/dL (ref 8.4–10.5)
Creatinine, Ser: 0.83 mg/dL (ref 0.50–1.10)
GFR calc non Af Amer: 81 mL/min — ABNORMAL LOW (ref >90)
GLUCOSE: 97 mg/dL (ref 70–99)
POTASSIUM: 4.2 meq/L (ref 3.7–5.3)
Sodium: 136 mEq/L — ABNORMAL LOW (ref 137–147)
Total Protein: 7.7 g/dL (ref 6.0–8.3)

## 2014-01-14 LAB — URINALYSIS, ROUTINE W REFLEX MICROSCOPIC
Glucose, UA: NEGATIVE mg/dL
KETONES UR: 40 mg/dL — AB
LEUKOCYTES UA: NEGATIVE
NITRITE: NEGATIVE
PROTEIN: NEGATIVE mg/dL
Specific Gravity, Urine: 1.021 (ref 1.005–1.030)
UROBILINOGEN UA: 0.2 mg/dL (ref 0.0–1.0)
pH: 5.5 (ref 5.0–8.0)

## 2014-01-14 LAB — CBC WITH DIFFERENTIAL/PLATELET
BASOS PCT: 0 % (ref 0–1)
Basophils Absolute: 0 10*3/uL (ref 0.0–0.1)
EOS ABS: 0.2 10*3/uL (ref 0.0–0.7)
Eosinophils Relative: 2 % (ref 0–5)
HEMATOCRIT: 37.7 % (ref 36.0–46.0)
Hemoglobin: 12.4 g/dL (ref 12.0–15.0)
LYMPHS ABS: 1.4 10*3/uL (ref 0.7–4.0)
Lymphocytes Relative: 18 % (ref 12–46)
MCH: 28.1 pg (ref 26.0–34.0)
MCHC: 32.9 g/dL (ref 30.0–36.0)
MCV: 85.3 fL (ref 78.0–100.0)
MONO ABS: 0.7 10*3/uL (ref 0.1–1.0)
Monocytes Relative: 9 % (ref 3–12)
NEUTROS ABS: 5.5 10*3/uL (ref 1.7–7.7)
NEUTROS PCT: 71 % (ref 43–77)
Platelets: 252 10*3/uL (ref 150–400)
RBC: 4.42 MIL/uL (ref 3.87–5.11)
RDW: 13.9 % (ref 11.5–15.5)
WBC: 7.8 10*3/uL (ref 4.0–10.5)

## 2014-01-14 LAB — URINE MICROSCOPIC-ADD ON

## 2014-01-14 MED ORDER — HYDROMORPHONE HCL PF 1 MG/ML IJ SOLN
1.0000 mg | Freq: Once | INTRAMUSCULAR | Status: AC
  Administered 2014-01-14: 1 mg via INTRAMUSCULAR
  Filled 2014-01-14: qty 1

## 2014-01-14 MED ORDER — DIAZEPAM 5 MG PO TABS
5.0000 mg | ORAL_TABLET | Freq: Once | ORAL | Status: AC
  Administered 2014-01-14: 5 mg via ORAL
  Filled 2014-01-14: qty 1

## 2014-01-14 MED ORDER — OXYCODONE-ACETAMINOPHEN 5-325 MG PO TABS: 1.0000 | ORAL_TABLET | Freq: Four times a day (QID) | ORAL | Status: DC | PRN

## 2014-01-14 MED ORDER — ONDANSETRON 8 MG PO TBDP
8.0000 mg | ORAL_TABLET | Freq: Once | ORAL | Status: AC
  Administered 2014-01-14: 8 mg via ORAL
  Filled 2014-01-14: qty 1

## 2014-01-14 NOTE — ED Provider Notes (Addendum)
CSN: [id number]     Arrival date & time 01/14/14  1554 History   First MD Initiated Contact with Patient 01/14/14 1628     Chief Complaint  Patient presents with  . Back Pain     (Consider location/radiation/quality/duration/timing/severity/associated sxs/prior Treatment) Patient is a 48 y.o. female presenting with back pain. The history is provided by the patient.  Back Pain Associated symptoms: no abdominal pain, no dysuria, no fever, no numbness and no weakness   pt with hx ddd, remote hx lumbar disc surgery and subsequent fusion, c/o low back pain x 3 weeks, and esp in past week. States had turned/moved funny removing pillow/cushion from under buttock at onset symptoms.  Denies other hx of significant trauma or fall. No radicular/leg pain. No problems w urinary retention or incontinence. No saddle anesthesia. No leg numbness/weakness.  Denies fever or chills. No anterior/abd/pelvic area pain.  Pain localized to lower lumbar area and bil lower back. Pain constant, dull, moder-severe, worse w certain position movements.         History reviewed. No pertinent past medical history. Past Surgical History  Procedure Laterality Date  . Back surgery    . Abdominal hysterectomy     No family history on file. History  Substance Use Topics  . Smoking status: Never Smoker   . Smokeless tobacco: Not on file  . Alcohol Use: No   OB History   Grav Para Term Preterm Abortions TAB SAB Ect Mult Living                 Review of Systems  Constitutional: Negative for fever and chills.  Gastrointestinal: Negative for vomiting, abdominal pain, diarrhea and abdominal distention.  Genitourinary: Negative for dysuria, hematuria, vaginal bleeding and vaginal discharge.  Musculoskeletal: Positive for back pain.  Skin: Negative for rash.  Neurological: Negative for weakness and numbness.      Allergies  Codeine and Contrast media  Home Medications   Prior to Admission medications    Medication Sig Start Date End Date Taking? Authorizing Carina Chaplin  HYDROcodone-acetaminophen (NORCO/VICODIN) 5-325 MG per tablet Take 1 tablet by mouth every 6 (six) hours as needed for moderate pain.   Yes Historical Kasim Mccorkle, MD  methocarbamol (ROBAXIN) 500 MG tablet Take 500 mg by mouth 2 (two) times daily as needed for muscle spasms.   Yes Historical Kerrilyn Azbill, MD  oxyCODONE (OXY IR/ROXICODONE) 5 MG immediate release tablet Take 5-10 mg by mouth every 4 (four) hours as needed for severe pain.   Yes Historical Blaike Vickers, MD  traMADol (ULTRAM) 50 MG tablet Take 50 mg by mouth every 6 (six) hours as needed for moderate pain.   Yes Historical Sumiya Mamaril, MD   BP 147/79  Pulse 125  Temp(Src) 98.4 F (36.9 C) (Oral)  Resp 18  SpO2 94% Physical Exam  Nursing note and vitals reviewed. Constitutional: She is oriented to person, place, and time. She appears well-developed and well-nourished. No distress.  HENT:  Nose: Nose normal.  Mouth/Throat: Oropharynx is clear and moist.  Eyes: Conjunctivae are normal. No scleral icterus.  Neck: Neck supple. No tracheal deviation present.  Cardiovascular: Normal rate, regular rhythm, normal heart sounds and intact distal pulses.   Pulmonary/Chest: Effort normal and breath sounds normal. No respiratory distress.  Abdominal: Soft. Normal appearance and bowel sounds are normal. She exhibits no distension and no mass. There is no tenderness. There is no rebound and no guarding.  Genitourinary:  No cva tenderness   Musculoskeletal: She exhibits no edema.  tls  spine non tender, aligned. Well healed lumbar surgical scar. No sts, erythema or rash to area. Bilateral mid to lower lumbar muscular tenderness.   Neurological: She is alert and oriented to person, place, and time.  Straight leg raise neg. Motor intact bil lower ext, 5/5. sens intact. symm reflexes.   Skin: Skin is warm and dry. No rash noted.  Psychiatric: She has a normal mood and affect.    ED Course   Procedures (including critical care time) Labs Review   Results for orders placed during the hospital encounter of 01/14/14  CBC WITH DIFFERENTIAL      Result Value Ref Range   WBC 7.8  4.0 - 10.5 K/uL   RBC 4.42  3.87 - 5.11 MIL/uL   Hemoglobin 12.4  12.0 - 15.0 g/dL   HCT 16.1  09.6 - 04.5 %   MCV 85.3  78.0 - 100.0 fL   MCH 28.1  26.0 - 34.0 pg   MCHC 32.9  30.0 - 36.0 g/dL   RDW 40.9  81.1 - 91.4 %   Platelets 252  150 - 400 K/uL   Neutrophils Relative % 71  43 - 77 %   Neutro Abs 5.5  1.7 - 7.7 K/uL   Lymphocytes Relative 18  12 - 46 %   Lymphs Abs 1.4  0.7 - 4.0 K/uL   Monocytes Relative 9  3 - 12 %   Monocytes Absolute 0.7  0.1 - 1.0 K/uL   Eosinophils Relative 2  0 - 5 %   Eosinophils Absolute 0.2  0.0 - 0.7 K/uL   Basophils Relative 0  0 - 1 %   Basophils Absolute 0.0  0.0 - 0.1 K/uL  COMPREHENSIVE METABOLIC PANEL      Result Value Ref Range   Sodium 136 (*) 137 - 147 mEq/L   Potassium 4.2  3.7 - 5.3 mEq/L   Chloride 96  96 - 112 mEq/L   CO2 26  19 - 32 mEq/L   Glucose, Bld 97  70 - 99 mg/dL   BUN 10  6 - 23 mg/dL   Creatinine, Ser 7.82  0.50 - 1.10 mg/dL   Calcium 9.6  8.4 - 95.6 mg/dL   Total Protein 7.7  6.0 - 8.3 g/dL   Albumin 3.7  3.5 - 5.2 g/dL   AST 51 (*) 0 - 37 U/L   ALT 44 (*) 0 - 35 U/L   Alkaline Phosphatase 112  39 - 117 U/L   Total Bilirubin 0.4  0.3 - 1.2 mg/dL   GFR calc non Af Amer 81 (*) >90 mL/min   GFR calc Af Amer >90  >90 mL/min  URINALYSIS, ROUTINE W REFLEX MICROSCOPIC      Result Value Ref Range   Color, Urine YELLOW  YELLOW   APPearance TURBID (*) CLEAR   Specific Gravity, Urine 1.021  1.005 - 1.030   pH 5.5  5.0 - 8.0   Glucose, UA NEGATIVE  NEGATIVE mg/dL   Hgb urine dipstick MODERATE (*) NEGATIVE   Bilirubin Urine SMALL (*) NEGATIVE   Ketones, ur 40 (*) NEGATIVE mg/dL   Protein, ur NEGATIVE  NEGATIVE mg/dL   Urobilinogen, UA 0.2  0.0 - 1.0 mg/dL   Nitrite NEGATIVE  NEGATIVE   Leukocytes, UA NEGATIVE  NEGATIVE  URINE  MICROSCOPIC-ADD ON      Result Value Ref Range   Squamous Epithelial / LPF MANY (*) RARE   WBC, UA 0-2  <3 WBC/hpf   Bacteria, UA  RARE  RARE   Casts HYALINE CASTS (*) NEGATIVE   Urine-Other MUCOUS PRESENT         L1-2: Negative.  L2-3: Mild facet degenerative change. No disc bulge or protrusion.  The central canal and foramina are widely patent.  L3-4: Mild facet degenerative disease. No disc bulge or protrusion.  The central canal and foramina are widely patent.  L4-5: Status post laminectomy and fusion. The central canal and  foramina are widely patent.  L5-S1: Status post laminectomy and fusion. The central canal and  foramina are widely patent.  IMPRESSION:  Negative for central canal or foraminal stenosis. The patient is  status post L4-S1 fusion without complicating feature.    MDM  [facility] - accessed results via Care Everywhere - noted above.   Reviewed nursing notes and prior charts for additional history.   Pt has ride, does not have to drive.  Dilaudid 1 mg im. zofran po.  Discussed pcp, back specialty follow up, possible PT.  Recheck, pt appears more comfortable and stable for d/c.   Discussed labs, po hydration,close pcp and back specialty follow up, return to ED if new symptoms, fevers, change in pain, numbness/weakness.        Suzi RootsKevin E Steinl, MD 01/14/14 [phone]

## 2014-01-14 NOTE — ED Notes (Signed)
MD at bedside. 

## 2014-01-14 NOTE — ED Notes (Signed)
Per pt, states she has a history of chronic back pain, has had 2 surgeries-had MRI done yesterday and nothing was seen-states she has been in bed since injuring back-states narcotics not working-states increased pain and wants relief

## 2014-01-14 NOTE — Discharge Instructions (Signed)
Take motrin or aleve as need for pain. You may also take percocet as need for pain. No driving for the next 6 hours or when taking percocet. Also, do not take tylenol or acetaminophen containing medication when taking percocet.  Take your robaxin as need.  You may try heat/heating pad, gentle massage.  You may also try seeing a physical therapist to help alleviate your back pain. Follow up with primary care doctor in the coming week for recheck. If back pain fails to improve/resolve in the next 1-2 weeks, follow up with back specialist - see referral. As narcotic pain medication can cause constipation, drink adequate fluids, get adequate fiber in diet, and you may try miralax (laxative) and colace (stool softener) as need - both available over the counter.   Your lab/urine tests show some indication of mild dehydration - drink plenty of fluids.  The lab tests also note very mild elevation of liver function tests (AST 51, ALT 44) - follow up with primary care doctor in the next couple weeks.   Return to ER right away if worse, new symptoms, fevers, leg numbness/weakness, urine retention or incontinence, other concern.  You were given pain medication in the ER - no driving for the next 4 hours.   Back Pain, Adult Low back pain is very common. About 1 in 5 people have back pain.The cause of low back pain is rarely dangerous. The pain often gets better over time.About half of people with a sudden onset of back pain feel better in just 2 weeks. About 8 in 10 people feel better by 6 weeks.  CAUSES Some common causes of back pain include:  Strain of the muscles or ligaments supporting the spine.  Wear and tear (degeneration) of the spinal discs.  Arthritis.  Direct injury to the back. DIAGNOSIS Most of the time, the direct cause of low back pain is not known.However, back pain can be treated effectively even when the exact cause of the pain is unknown.Answering your caregiver's questions about  your overall health and symptoms is one of the most accurate ways to make sure the cause of your pain is not dangerous. If your caregiver needs more information, he or she may order lab work or imaging tests (X-rays or MRIs).However, even if imaging tests show changes in your back, this usually does not require surgery. HOME CARE INSTRUCTIONS For many people, back pain returns.Since low back pain is rarely dangerous, it is often a condition that people can learn to [facility] their own.   Remain active. It is stressful on the back to sit or stand in one place. Do not sit, drive, or stand in one place for more than 30 minutes at a time. Take short walks on level surfaces as soon as pain allows.Try to increase the length of time you walk each day.  Do not stay in bed.Resting more than 1 or 2 days can delay your recovery.  Do not avoid exercise or work.Your body is made to move.It is not dangerous to be active, even though your back may hurt.Your back will likely heal faster if you return to being active before your pain is gone.  Pay attention to your body when you bend and lift. Many people have less discomfortwhen lifting if they bend their knees, keep the load close to their bodies,and avoid twisting. Often, the most comfortable positions are those that put less stress on your recovering back.  Find a comfortable position to sleep. Use a firm mattress  and lie on your side with your knees slightly bent. If you lie on your back, put a pillow under your knees.  Only take over-the-counter or prescription medicines as directed by your caregiver. Over-the-counter medicines to reduce pain and inflammation are often the most helpful.Your caregiver may prescribe muscle relaxant drugs.These medicines help dull your pain so you can more quickly return to your normal activities and healthy exercise.  Put ice on the injured area.  Put ice in a plastic bag.  Place a towel between your skin and the  bag.  Leave the ice on for 15-20 minutes, 03-04 times a day for the first 2 to 3 days. After that, ice and heat may be alternated to reduce pain and spasms.  Ask your caregiver about trying back exercises and gentle massage. This may be of some benefit.  Avoid feeling anxious or stressed.Stress increases muscle tension and can worsen back pain.It is important to recognize when you are anxious or stressed and learn ways to manage it.Exercise is a great option. SEEK MEDICAL CARE IF:  You have pain that is not relieved with rest or medicine.  You have pain that does not improve in 1 week.  You have new symptoms.  You are generally not feeling well. SEEK IMMEDIATE MEDICAL CARE IF:   You have pain that radiates from your back into your legs.  You develop new bowel or bladder control problems.  You have unusual weakness or numbness in your arms or legs.  You develop nausea or vomiting.  You develop abdominal pain.  You feel faint. Document Released: 07/14/2005 Document Revised: 01/13/2012 Document Reviewed: 12/02/2010 [facility] Patient Information 2015 [place], [place]. This information is not intended to replace advice given to you by your health care provider. Make sure you discuss any questions you have with your health care provider.   Heat Therapy Heat therapy can help ease achy, tense, stiff, and tight muscles and joints. Heat should not be used on new injuries. Wait at least 48 hours after the injury before using heat therapy. Heat also should not be used for discomfort or pain that occurs right after doing an activity. If you still have pain or stiffness 3 hours after finishing the activity, then heat therapy may be used. PRECAUTIONS  High heat or prolonged exposure to heat can cause burns. Be careful when using heat therapy to avoid burning your skin. If you have any of the following conditions, do not use heat until you have discussed heat therapy with your caregiver:  Poor  circulation.  Healing wounds or scarred skin in the area being treated.  Diabetes, heart disease, or high blood pressure.  Numbness of the area being treated.  Unusual swelling of the area being treated.  Active infections.  Blood clots.  Cancer.  Inability to communicate your response to pain. This can include young children and people with dementia. HOME CARE INSTRUCTIONS Moist heat pack  Soak a clean towel in warm water, and squeeze out the extra water. The water temperature should be comfortable to the skin.  Put the warm, wet towel in a plastic bag.  Place a thin, dry towel between your skin and the bag.  Put the heat pack on the area for 5 minutes, and check your skin. Your skin may be pink, but it should not be red.  Leave the heat pack on the area for a total of 15 to 30 minutes.  Repeat this every 2 to 4 hours while awake. Do not  use heat while you are sleeping. Warm water bath  Fill a tub with warm water. The water temperature should be comfortable to the skin.  Place the affected body part in the tub.  Soak the area for 20 to 40 minutes.  Repeat as needed. Hot water bottle  Fill the water bottle half full with hot water.  Press out the extra air. Close the cap tightly.  Place a dry towel between your skin and the bottle.  Put the bottle on the area for 5 minutes, and check your skin. Your skin may be pink, but it should not be red.  Leave the bottle on the area for a total of 15 to 30 minutes.  Repeat this every 2 to 4 hours while awake. Electric heating pad  Place a dry towel between your skin and the heating pad.  Set the heating pad on low heat.  Put the heating pad on the area for 10 minutes, and check your skin. Your skin may be pink, but it should not be red.  Leave the heating pad on the area for a total of 20 to 40 minutes.  Repeat this every 2 to 4 hours while awake.  Do not lie on the heating pad.  Do not fall asleep while using  the heating pad.  Do not use the heating pad near water. Contact with water can result in an electrical shock. SEEK MEDICAL CARE IF:  You have blisters, redness, swelling, or numbness.  You have any new problems.  Your problems are getting worse.  You have any questions or concerns. If you develop any problems, stop using heat therapy until you see your caregiver. MAKE SURE YOU:  Understand these instructions.  Will watch your condition.  Will get help right away if you are not doing well or get worse. Document Released: 10/06/2011 Document Reviewed: 10/06/2011 [facility] Patient Information 2015 [place]. This information is not intended to replace advice given to you by your health care provider. Make sure you discuss any questions you have with your health care provider.

## 2014-04-12 ENCOUNTER — Encounter (HOSPITAL_COMMUNITY): Payer: Self-pay | Admitting: Emergency Medicine

## 2014-04-12 ENCOUNTER — Emergency Department (HOSPITAL_COMMUNITY)
Admission: EM | Admit: 2014-04-12 | Discharge: 2014-04-12 | Disposition: A | Discharge status: Home / Self Care | Payer: BC Managed Care – PPO | Attending: Emergency Medicine | Admitting: Emergency Medicine

## 2014-04-12 DIAGNOSIS — G8929 Other chronic pain: Secondary | ICD-10-CM | POA: Diagnosis not present

## 2014-04-12 DIAGNOSIS — Z9889 Other specified postprocedural states: Secondary | ICD-10-CM | POA: Diagnosis not present

## 2014-04-12 DIAGNOSIS — M545 Low back pain, unspecified: Secondary | ICD-10-CM | POA: Insufficient documentation

## 2014-04-12 MED ORDER — DEXAMETHASONE SODIUM PHOSPHATE 10 MG/ML IJ SOLN
10.0000 mg | Freq: Once | INTRAMUSCULAR | Status: AC
  Administered 2014-04-12: 10 mg via INTRAMUSCULAR
  Filled 2014-04-12: qty 1

## 2014-04-12 MED ORDER — HYDROMORPHONE HCL 2 MG/ML IJ SOLN
2.0000 mg | Freq: Once | INTRAMUSCULAR | Status: AC
  Administered 2014-04-12: 2 mg via INTRAMUSCULAR
  Filled 2014-04-12: qty 1

## 2014-04-12 MED ORDER — KETOROLAC TROMETHAMINE 60 MG/2ML IM SOLN
60.0000 mg | Freq: Once | INTRAMUSCULAR | Status: AC
  Administered 2014-04-12: 60 mg via INTRAMUSCULAR
  Filled 2014-04-12: qty 2

## 2014-04-12 MED ORDER — FAMOTIDINE 20 MG PO TABS: 20.0000 mg | ORAL_TABLET | Freq: Two times a day (BID) | ORAL | Status: DC

## 2014-04-12 NOTE — ED Notes (Addendum)
Pt states she has had lower back pain since last week. Pt has hx of lower back pain due to breaking her back when she was 48 years old. Pt states it is now very difficult to walk due to the back pain. Pt took  oxycodone and flexenl at [facility]; she tried to get appointment at primary care but was unable to schedule an appointment soon enough.

## 2014-04-12 NOTE — Discharge Instructions (Signed)
SEEK IMMEDIATE MEDICAL ATTENTION IF: °New numbness, tingling, weakness, or problem with the use of your arms or legs.  °Severe back pain not relieved with medications.  °Change in bowel or bladder control.  °Increasing pain in any areas of the body (such as chest or abdominal pain).  °Shortness of breath, dizziness or fainting.  °Nausea (feeling sick to your stomach), vomiting, fever, or sweats. ° ° °Chronic Pain Discharge Instructions  °Emergency care providers appreciate that many patients coming to us are in severe pain and we wish to address their pain in the safest, most responsible manner.  It is important to recognize however, that the proper treatment of chronic pain differs from that of the pain of injuries and acute illnesses.  Our goal is to provide quality, safe, personalized care and we thank you for giving us the opportunity to serve you. °The use of narcotics and related agents for chronic pain syndromes [place] lead to additional physical and psychological problems.  Nearly as many people die from prescription narcotics each year as die from car crashes.  Additionally, this risk is increased if such prescriptions are obtained from a variety of sources.  Therefore, only your primary care physician or a pain management specialist is able to safely treat such syndromes with narcotic medications long-term.   ° °Documentation revealing such prescriptions have been sought from multiple sources [place] prohibit us from providing a refill or different narcotic medication.  Your name [place] be checked first through the [place] Controlled Substances Reporting System.  This database is a record of controlled substance medication prescriptions that the patient has received.  This has been established by [place] in an effort to eliminate the dangerous, and often life threatening, practice of obtaining multiple prescriptions from different medical providers.  ° °If you have a chronic pain syndrome (i.e. chronic  headaches, recurrent back or neck pain, dental pain, abdominal or pelvis pain without a specific diagnosis, or neuropathic pain such as fibromyalgia) or recurrent visits for the same condition without an acute diagnosis, you [place] be treated with non-narcotics and other non-addictive medicines.  Allergic reactions or negative side effects that [place] be reported by a patient to such medications will not typically lead to the use of a narcotic analgesic or other controlled substance as an alternative. °  °Patients managing chronic pain with a personal physician should have provisions in place for breakthrough pain.  If you are in crisis, you should call your physician.  If your physician directs you to the emergency department, please have the doctor call and speak to our attending physician concerning your care. °  °When patients come to the Emergency Department (ED) with acute medical conditions in which the Emergency Department physician feels appropriate to prescribe narcotic or sedating pain medication, the physician will prescribe these in very limited quantities.  The amount of these medications will last only until you can see your primary care physician in his/her office.  Any patient who returns to the ED seeking refills should expect only non-narcotic pain medications.  ° °In the event of an acute medical condition exists and the emergency physician feels it is necessary that the patient be given a narcotic or sedating medication -  a responsible adult driver should be present in the room prior to the medication being given by the nurse. °  °Prescriptions for narcotic or sedating medications that have been lost, stolen or expired will not be refilled in the Emergency Department.   ° °Patients who   have chronic pain may receive non-narcotic prescriptions until seen by their primary care physician.  It is every patients personal responsibility to maintain active prescriptions with his or her primary care  physician or specialist.  Back Pain, Adult Low back pain is very common. About 1 in 5 people have back pain.The cause of low back pain is rarely dangerous. The pain often gets better over time.About half of people with a sudden onset of back pain feel better in just 2 weeks. About 8 in 10 people feel better by 6 weeks.  CAUSES Some common causes of back pain include:  Strain of the muscles or ligaments supporting the spine.  Wear and tear (degeneration) of the spinal discs.  Arthritis.  Direct injury to the back. DIAGNOSIS Most of the time, the direct cause of low back pain is not known.However, back pain can be treated effectively even when the exact cause of the pain is unknown.Answering your caregiver's questions about your overall health and symptoms is one of the most accurate ways to make sure the cause of your pain is not dangerous. If your caregiver needs more information, he or she may order lab work or imaging tests (X-rays or MRIs).However, even if imaging tests show changes in your back, this usually does not require surgery. HOME CARE INSTRUCTIONS For many people, back pain returns.Since low back pain is rarely dangerous, it is often a condition that people can learn to [facility] their own.   Remain active. It is stressful on the back to sit or stand in one place. Do not sit, drive, or stand in one place for more than 30 minutes at a time. Take short walks on level surfaces as soon as pain allows.Try to increase the length of time you walk each day.  Do not stay in bed.Resting more than 1 or 2 days can delay your recovery.  Do not avoid exercise or work.Your body is made to move.It is not dangerous to be active, even though your back may hurt.Your back will likely heal faster if you return to being active before your pain is gone.  Pay attention to your body when you bend and lift. Many people have less discomfortwhen lifting if they bend their knees, keep the load  close to their bodies,and avoid twisting. Often, the most comfortable positions are those that put less stress on your recovering back.  Find a comfortable position to sleep. Use a firm mattress and lie on your side with your knees slightly bent. If you lie on your back, put a pillow under your knees.  Only take over-the-counter or prescription medicines as directed by your caregiver. Over-the-counter medicines to reduce pain and inflammation are often the most helpful.Your caregiver may prescribe muscle relaxant drugs.These medicines help dull your pain so you can more quickly return to your normal activities and healthy exercise.  Put ice on the injured area.  Put ice in a plastic bag.  Place a towel between your skin and the bag.  Leave the ice on for 15-20 minutes, 03-04 times a day for the first 2 to 3 days. After that, ice and heat may be alternated to reduce pain and spasms.  Ask your caregiver about trying back exercises and gentle massage. This may be of some benefit.  Avoid feeling anxious or stressed.Stress increases muscle tension and can worsen back pain.It is important to recognize when you are anxious or stressed and learn ways to manage it.Exercise is a great option. SEEK MEDICAL CARE IF:  You have pain that is not relieved with rest or medicine.  You have pain that does not improve in 1 week.  You have new symptoms.  You are generally not feeling well. SEEK IMMEDIATE MEDICAL CARE IF:   You have pain that radiates from your back into your legs.  You develop new bowel or bladder control problems.  You have unusual weakness or numbness in your arms or legs.  You develop nausea or vomiting.  You develop abdominal pain.  You feel faint. Document Released: 07/14/2005 Document Revised: 01/13/2012 Document Reviewed: 11/15/2013 [facility] Patient Information 2015 [place], [place]. This information is not intended to replace advice given to you by your health care  provider. Make sure you discuss any questions you have with your health care provider.

## 2014-04-12 NOTE — ED Provider Notes (Signed)
CSN: [id number]     Arrival date & time 04/12/14  1828 History  This chart was scribed for non-physician practitioner, Arthor Captain, PA-C working with Juliet Rude. Rubin Payor, MD, by Modena Jansky, ED Scribe. This patient was seen in room WTR6/WTR6 and the patient's care was started at 7:24 PM.  Chief Complaint  Patient presents with  . Back Pain   The history is provided by the patient. No language interpreter was used.   HPI Comments: [name] [name] is a 48 y.o. female who presents to the Emergency Department complaining of constant moderate back pain that started last week. Pt reports that she has a hx of back injury when she was 48 years old. She states that she went to [facility] and is scheduled for back injections. She reports that the pain is non radiating. She states that she has difficulty ambulating and standing due to pain. She reports that she has been taking oxy IR that she got from her PCP without any relief. She states that she has a hx of back surgery. She reports that she is allergic to codeine. She denies any weakness, loss bladder or bowel control, or photophobia.   History reviewed. No pertinent past medical history. Past Surgical History  Procedure Laterality Date  . Back surgery    . Abdominal hysterectomy     No family history on file. History  Substance Use Topics  . Smoking status: Never Smoker   . Smokeless tobacco: Not on file  . Alcohol Use: No   OB History   Grav Para Term Preterm Abortions TAB SAB Ect Mult Living                 Review of Systems  Constitutional: Negative for fever and chills.  Genitourinary: Negative for enuresis.  Musculoskeletal: Positive for back pain.  Neurological: Negative for weakness.  All other systems reviewed and are negative.   Allergies  Codeine and Contrast media  Home Medications   Prior to Admission medications   Medication Sig Start Date End Date Taking? Authorizing Provider   HYDROcodone-acetaminophen (NORCO/VICODIN) 5-325 MG per tablet Take 1 tablet by mouth every 6 (six) hours as needed for moderate pain.    Historical Provider, MD  methocarbamol (ROBAXIN) 500 MG tablet Take 500 mg by mouth 2 (two) times daily as needed for muscle spasms.    Historical Provider, MD  oxyCODONE (OXY IR/ROXICODONE) 5 MG immediate release tablet Take 5-10 mg by mouth every 4 (four) hours as needed for severe pain.    Historical Provider, MD  oxyCODONE-acetaminophen (PERCOCET/ROXICET) 5-325 MG per tablet Take 1-2 tablets by mouth every 6 (six) hours as needed for severe pain. 01/14/14   Suzi Roots, MD  traMADol (ULTRAM) 50 MG tablet Take 50 mg by mouth every 6 (six) hours as needed for moderate pain.    Historical Provider, MD   BP 112/75  Pulse 110  Temp(Src) 98.7 F (37.1 C) (Oral)  Resp 18  SpO2 97% Physical Exam  Nursing note and vitals reviewed. Constitutional: She is oriented to person, place, and time. She appears well-developed and well-nourished. No distress.  HENT:  Head: Normocephalic and atraumatic.  Neck: Neck supple. No tracheal deviation present.  Cardiovascular: Normal rate.   Pulmonary/Chest: Effort normal. No respiratory distress.  Musculoskeletal: Normal range of motion.  Active spasms in the tissues of lumbar spine. ROM is limited. No midline tenderness.   Neurological: She is alert and oriented to person, place, and time. She has normal  reflexes.  Pt has difficulty standing   Skin: Skin is warm and dry.  Psychiatric: She has a normal mood and affect. Her behavior is normal.    ED Course  Procedures (including critical care time) DIAGNOSTIC STUDIES: Oxygen Saturation is 97% on RA, normal by my interpretation.    COORDINATION OF CARE: 7:24 PM- Pt advised of plan for treatment and pt agrees.  Labs Review Labs Reviewed - No data to display  Imaging Review No results found.   EKG Interpretation None      MDM   Final diagnoses:  None    Got a  5 day supply of oxycodone 5 (Qty 60) on 9/6.   Patient with back pain.  No neurological deficits and normal neuro exam.  Patient can walk but states is painful.  No loss of bowel or bladder control.  No concern for cauda equina.  No fever, night sweats, weight loss, h/o cancer, IVDU.  RICE protocol and pain medicine indicated and discussed with patient.   I discussed the Chronic pain policy of the ED with the patient who expresses her understanding and agrees with the plan of care.  I personally performed the services described in this documentation, which was scribed in my presence. The recorded information has been reviewed and is accurate.      Arthor Captain, PA-C 04/13/14 1641  Arthor Captain, PA-C 04/13/14 1644

## 2014-04-14 NOTE — ED Provider Notes (Signed)
Medical screening examination/treatment/procedure(s) were performed by non-physician practitioner and as supervising physician I was immediately available for consultation/collaboration.   EKG Interpretation None       Shiloh Swopes R. Valeria Krisko, MD 04/14/14 0006 

## 2014-08-01 ENCOUNTER — Emergency Department (HOSPITAL_COMMUNITY)
Admission: EM | Admit: 2014-08-01 | Discharge: 2014-08-01 | Disposition: A | Discharge status: Home / Self Care | Payer: BLUE CROSS/BLUE SHIELD | Attending: Emergency Medicine | Admitting: Emergency Medicine

## 2014-08-01 ENCOUNTER — Encounter (HOSPITAL_COMMUNITY): Payer: Self-pay

## 2014-08-01 DIAGNOSIS — G8929 Other chronic pain: Secondary | ICD-10-CM | POA: Diagnosis not present

## 2014-08-01 DIAGNOSIS — Z981 Arthrodesis status: Secondary | ICD-10-CM | POA: Diagnosis not present

## 2014-08-01 DIAGNOSIS — Z79899 Other long term (current) drug therapy: Secondary | ICD-10-CM | POA: Insufficient documentation

## 2014-08-01 DIAGNOSIS — M545 Low back pain: Secondary | ICD-10-CM | POA: Diagnosis present

## 2014-08-01 DIAGNOSIS — M5442 Lumbago with sciatica, left side: Secondary | ICD-10-CM

## 2014-08-01 LAB — URINE MICROSCOPIC-ADD ON

## 2014-08-01 LAB — URINALYSIS, ROUTINE W REFLEX MICROSCOPIC
BILIRUBIN URINE: NEGATIVE
Glucose, UA: NEGATIVE mg/dL
KETONES UR: NEGATIVE mg/dL
Leukocytes, UA: NEGATIVE
NITRITE: NEGATIVE
PH: 5.5 (ref 5.0–8.0)
PROTEIN: NEGATIVE mg/dL
SPECIFIC GRAVITY, URINE: 1.01 (ref 1.005–1.030)
UROBILINOGEN UA: 0.2 mg/dL (ref 0.0–1.0)

## 2014-08-01 MED ORDER — NAPROXEN 500 MG PO TABS: 500.0000 mg | ORAL_TABLET | Freq: Two times a day (BID) | ORAL | Status: DC

## 2014-08-01 MED ORDER — DIAZEPAM 5 MG PO TABS
5.0000 mg | ORAL_TABLET | Freq: Once | ORAL | Status: AC
  Administered 2014-08-01: 5 mg via ORAL
  Filled 2014-08-01: qty 1

## 2014-08-01 MED ORDER — KETOROLAC TROMETHAMINE 60 MG/2ML IM SOLN
60.0000 mg | Freq: Once | INTRAMUSCULAR | Status: AC
  Administered 2014-08-01: 60 mg via INTRAMUSCULAR
  Filled 2014-08-01: qty 2

## 2014-08-01 MED ORDER — DIAZEPAM 5 MG PO TABS: 5.0000 mg | ORAL_TABLET | Freq: Two times a day (BID) | ORAL | Status: DC

## 2014-08-01 NOTE — Discharge Instructions (Signed)
Please follow the directions provided.  Be sure to follow-up with your orthopedic doctor to further manage your back pain.  Take the naproxen twice a day for inflammation and the valium as directed for muscle spasms.  Don't hesitate to return for any new, worsening or concerning symptoms.     SEEK IMMEDIATE MEDICAL CARE IF:  You have pain that radiates from your back into your legs.  You develop new bowel or bladder control problems.  You have unusual weakness or numbness in your arms or legs.  You develop nausea or vomiting.  You develop abdominal pain.  You feel faint.

## 2014-08-01 NOTE — ED Notes (Signed)
Pt having back pain since last Tuesday.  Pt states chronic back pain.  Had shots before to get it under control.

## 2014-08-01 NOTE — ED Provider Notes (Signed)
CSN: [id number]     Arrival date & time 08/01/14  [id number] History   First MD Initiated Contact with Patient 08/01/14 [phone]     Chief Complaint  Patient presents with  . Back Pain   (Consider location/radiation/quality/duration/timing/severity/associated sxs/prior Treatment) HPI [name] [name] is a 49 yo female presenting with report of back pain x 1 week.  She has a history of chronic back pain and has spinal fusions in the past, the last one was in 2002.  She reports being under the care of an orthopedic doctor for her neck pain also and had an IM injection last week.  Later that evening her back pain began.  She reports the pain is in her lower back on the left side and radiates down her left leg.  She reports she is incontinent of urine when she stands up, however the last episode of this was reportedly yesterday and she has been able to urinate within her control since then.  She denies alterations in sensation to her rectum or saddle area.  She denies IVDU, fevers, chills, or recent injury.   History reviewed. No pertinent past medical history. Past Surgical History  Procedure Laterality Date  . Back surgery    . Abdominal hysterectomy     History reviewed. No pertinent family history. History  Substance Use Topics  . Smoking status: Never Smoker   . Smokeless tobacco: Not on file  . Alcohol Use: No   OB History    No data available     Review of Systems  Constitutional: Negative for fever and chills.  Eyes: Negative for visual disturbance.  Respiratory: Negative for cough and shortness of breath.   Cardiovascular: Negative for chest pain and leg swelling.  Gastrointestinal: Negative for nausea and vomiting.  Genitourinary: Negative for dysuria.  Musculoskeletal: Positive for back pain and gait problem. Negative for myalgias.  Skin: Negative for rash.  Neurological: Negative for weakness, numbness and headaches.      Allergies  Codeine and Contrast media  Home Medications    Prior to Admission medications   Medication Sig Start Date End Date Taking? Authorizing Provider  famotidine (PEPCID) 20 MG tablet Take 1 tablet (20 mg total) by mouth 2 (two) times daily. 04/12/14   Arthor CaptainAbigail Harris, PA-C  HYDROcodone-acetaminophen (NORCO/VICODIN) 5-325 MG per tablet Take 1 tablet by mouth every 6 (six) hours as needed for moderate pain.    Historical Provider, MD  methocarbamol (ROBAXIN) 500 MG tablet Take 500 mg by mouth 2 (two) times daily as needed for muscle spasms.    Historical Provider, MD  oxyCODONE (OXY IR/ROXICODONE) 5 MG immediate release tablet Take 5-10 mg by mouth every 4 (four) hours as needed for severe pain.    Historical Provider, MD  oxyCODONE-acetaminophen (PERCOCET/ROXICET) 5-325 MG per tablet Take 1-2 tablets by mouth every 6 (six) hours as needed for severe pain. 01/14/14   Suzi RootsKevin E Steinl, MD  traMADol (ULTRAM) 50 MG tablet Take 50 mg by mouth every 6 (six) hours as needed for moderate pain.    Historical Provider, MD   BP 126/77 mmHg  Pulse 106  Temp(Src) 98.3 F (36.8 C) (Oral)  Resp 20  SpO2 98% Physical Exam  Constitutional: She appears well-developed and well-nourished. No distress.  HENT:  Head: Normocephalic and atraumatic.  Mouth/Throat: Oropharynx is clear and moist. No oropharyngeal exudate.  Eyes: Conjunctivae are normal.  Neck: Neck supple. No thyromegaly present.  Cardiovascular: Normal rate, regular rhythm and intact distal pulses.  Pulmonary/Chest: Effort normal and breath sounds normal. No respiratory distress.  Abdominal: Soft. There is no tenderness.  Musculoskeletal:       Lumbar back: She exhibits decreased range of motion and tenderness. She exhibits no bony tenderness, no swelling, no edema and no deformity.       Back:  Lymphadenopathy:    She has no cervical adenopathy.  Neurological: She is alert. She has normal strength. No cranial nerve deficit or sensory deficit. GCS eye subscore is 4. GCS verbal subscore is 5. GCS  motor subscore is 6.  Reflex Scores:      Patellar reflexes are 2+ on the right side and 2+ on the left side. Skin: Skin is warm and dry. No rash noted. She is not diaphoretic.  Psychiatric: She has a normal mood and affect.  Nursing note and vitals reviewed.   ED Course  Procedures (including critical care time) Labs Review Labs Reviewed  URINALYSIS, ROUTINE W REFLEX MICROSCOPIC    Imaging Review No results found.   EKG Interpretation None      MDM   Final diagnoses:  Left-sided low back pain with left-sided sciatica   49 yo with recurrent back pain currently under the care of a orthopedic surgeon. Her pain is completely musculoskeletal on exam and she has no neurological deficits and normal neuro exam.  Patient can walk but states is painful.  She does endorse an episode of urinary incontinence however has had full bowel and bladder control with no alterations in sensation to her groin or legs. No concern for cauda equina.  No fever, night sweats, weight loss, h/o cancer, IVDU.  RICE protocol and pain medicine indicated and discussed with patient. Pain improved in the ED. Pt is well-appearing, in no acute distress and vital signs are stable.  They appear safe to be discharged.  Discharge include follow-up with their PCP.  Return precautions provided.   Filed Vitals:   08/01/14 0840 08/01/14 1057 08/01/14 1204  BP: 126/77 106/72 122/82  Pulse: 106 73 89  Temp: 98.3 F (36.8 C)  98 F (36.7 C)  TempSrc: Oral  Oral  Resp: SpO2: 98% 100% 100%   Meds given in ED:  Medications  ketorolac (TORADOL) injection 60 mg (not administered)  diazepam (VALIUM) tablet 5 mg (5 mg Oral Given 08/01/14 0933)    Discharge Medication List as of 08/01/2014 11:35 AM    START taking these medications   Details  diazepam (VALIUM) 5 MG tablet Take 1 tablet (5 mg total) by mouth 2 (two) times daily., Starting 08/01/2014, Until Discontinued, Print    naproxen (NAPROSYN) 500 MG tablet  Take 1 tablet (500 mg total) by mouth 2 (two) times daily., Starting 08/01/2014, Until Discontinued, Print           Harle Battiest, NP 08/03/14 0016  Juliet Rude. Rubin Payor, MD 08/04/14 6213

## 2015-03-05 ENCOUNTER — Encounter (HOSPITAL_BASED_OUTPATIENT_CLINIC_OR_DEPARTMENT_OTHER): Payer: Self-pay | Admitting: *Deleted

## 2015-03-05 NOTE — Progress Notes (Signed)
NPO AFTER MN. ARRIVE AT 0600. NEEDS CBC. 

## 2015-03-06 NOTE — H&P (Signed)
[name], [name] [name] NO.:  [id number]  MEDICAL RECORD NO.:  [id number]  LOCATION:                               FACILITY:  [facility]  PHYSICIAN:  Juluis Mire, M.D.   DATE OF BIRTH:  [birth year]  DATE OF ADMISSION:  03/09/2015 DATE OF DISCHARGE:                             HISTORY & PHYSICAL   HISTORY OF PRESENT ILLNESS:  The patient is a 49 year old, gravida 2, para 2 female, referred to our practice by Dr. Earlene Plater given the evaluation of chronic pelvic pain.  The patient has been having trouble with pelvic pain since February.  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  She describes the pain as an abdominal pain and pressure.  She also has a pinching sensation at the vagina.  When she is sexually active, she does have pain and discomfort with intercourse.  She had been seen by urologist in [place] and had a negative workup for interstitial cystitis.  We did an ultrasound in the office.  It was fairly unremarkable.  The adnexa unremarkable.  There was no fluid in the cul-de-sac.  Presumptive bleeding, we discussed the possibility of adhesions versus __________ endometriosis.  We have discussed options including trying to suppress this hormonally versus proceeding with laparoscopic lysis of adhesions. She __________ more depended surgery with laparoscopic removal both tubes and ovaries and we are going to inject at top of the vaginal cuff with a combination of steroids and Marcaine.  ALLERGIES:  In terms of allergies, she is allergic to CODEINE.  MEDICATIONS:  None.  PAST MEDICAL HISTORY:  She has a history of diverticulosis.  She has a history of urinary tract infections and had normal Pap smears.  PAST SURGICAL HISTORY:  She had a C-section in 1987, had a hysterectomy in 1991.  She has had spinal fusion in 1999, 2000 and 2002.  SOCIAL HISTORY:  There was no tobacco or alcohol use.  FAMILY HISTORY:   Noncontributory.  REVIEW OF SYSTEMS:  Noncontributory.  PHYSICAL EXAMINATION:  VITAL SIGNS:  The patient is afebrile with stable vital signs. HEENT:  The patient is normocephalic.  Pupils are equal, round, and reactive to light and accommodation.  Extraocular movements are intact. Sclerae and conjunctivae are clear.  Oropharynx is clear.  Thyroid not examined. BREASTS:  Not examined. LUNGS:  Clear. CARDIOVASCULAR:  Regular rhythm and rate without murmurs or gallops. ABDOMEN:  Benign.  Well-healed, low-transverse incision.  No mass, organomegaly, minimal suprapubic tenderness. PELVIC:  Normal external genitalia.  Vaginal mucosa is clear.  Cuff is intact.  She does have some mild tenderness in that area, but otherwise unremarkable. EXTREMITIES:  Trace edema. NEUROLOGIC:  Grossly within normal limits.  IMPRESSION:  Chronic pelvic pain, possible pelvic adhesions.  PLAN OF MANAGEMENT:  Again after discussion of options, we are going to proceed with attempted laparoscopic bilateral salpingo-oophorectomy and injection of the vaginal cuff.  The risks of surgery have been discussed including the risk of infection.  Risk of hemorrhage and could require transfusion with the risk of AIDS or hepatitis.  Risk of injury to adjacent organs such as bladder, bowel,  or ureters, or could require further exploratory surgery.  Risk of deep venous thrombosis and pulmonary embolus.  The patient expressed understanding of indications, risks, and alternatives.  We will do a cystoscopy after removal of the ovaries to __________ that there was no damage to the ureters.     Juluis Mire, M.D.     JSM/MEDQ  D:  03/06/2015  T:  03/06/2015  Job:  [id number]

## 2015-03-06 NOTE — H&P (Signed)
  Patient name  [name] [name], [name] [name] DICTATION#[id number] CSN# [id number]  Juluis Mire, MD 03/06/2015 4:59 AM

## 2015-03-09 ENCOUNTER — Ambulatory Visit (HOSPITAL_BASED_OUTPATIENT_CLINIC_OR_DEPARTMENT_OTHER): Payer: BLUE CROSS/BLUE SHIELD | Admitting: Anesthesiology

## 2015-03-09 ENCOUNTER — Ambulatory Visit (HOSPITAL_BASED_OUTPATIENT_CLINIC_OR_DEPARTMENT_OTHER)
Admission: RE | Admit: 2015-03-09 | Discharge: 2015-03-09 | Disposition: A | Discharge status: Home / Self Care | Payer: BLUE CROSS/BLUE SHIELD | Source: Ambulatory Visit | Attending: Obstetrics and Gynecology | Admitting: Obstetrics and Gynecology

## 2015-03-09 ENCOUNTER — Encounter (HOSPITAL_BASED_OUTPATIENT_CLINIC_OR_DEPARTMENT_OTHER): Payer: Self-pay

## 2015-03-09 ENCOUNTER — Encounter (HOSPITAL_BASED_OUTPATIENT_CLINIC_OR_DEPARTMENT_OTHER)
Admission: RE | Disposition: A | Discharge status: Home / Self Care | Payer: Self-pay | Source: Ambulatory Visit | Attending: Obstetrics and Gynecology

## 2015-03-09 DIAGNOSIS — N839 Noninflammatory disorder of ovary, fallopian tube and broad ligament, unspecified: Secondary | ICD-10-CM | POA: Insufficient documentation

## 2015-03-09 DIAGNOSIS — N941 Dyspareunia: Secondary | ICD-10-CM | POA: Insufficient documentation

## 2015-03-09 DIAGNOSIS — Z8541 Personal history of malignant neoplasm of cervix uteri: Secondary | ICD-10-CM | POA: Diagnosis not present

## 2015-03-09 DIAGNOSIS — Z90710 Acquired absence of both cervix and uterus: Secondary | ICD-10-CM | POA: Diagnosis not present

## 2015-03-09 DIAGNOSIS — K5730 Diverticulosis of large intestine without perforation or abscess without bleeding: Secondary | ICD-10-CM | POA: Diagnosis not present

## 2015-03-09 DIAGNOSIS — Z8542 Personal history of malignant neoplasm of other parts of uterus: Secondary | ICD-10-CM | POA: Diagnosis not present

## 2015-03-09 DIAGNOSIS — N736 Female pelvic peritoneal adhesions (postinfective): Secondary | ICD-10-CM | POA: Insufficient documentation

## 2015-03-09 DIAGNOSIS — G8929 Other chronic pain: Secondary | ICD-10-CM | POA: Diagnosis not present

## 2015-03-09 DIAGNOSIS — Z885 Allergy status to narcotic agent status: Secondary | ICD-10-CM | POA: Insufficient documentation

## 2015-03-09 DIAGNOSIS — Z981 Arthrodesis status: Secondary | ICD-10-CM | POA: Insufficient documentation

## 2015-03-09 DIAGNOSIS — K219 Gastro-esophageal reflux disease without esophagitis: Secondary | ICD-10-CM | POA: Diagnosis not present

## 2015-03-09 DIAGNOSIS — R102 Pelvic and perineal pain: Secondary | ICD-10-CM | POA: Diagnosis present

## 2015-03-09 DIAGNOSIS — Z90722 Acquired absence of ovaries, bilateral: Secondary | ICD-10-CM

## 2015-03-09 HISTORY — PX: LAPAROSCOPIC SALPINGO OOPHERECTOMY: SHX5927

## 2015-03-09 HISTORY — DX: Personal history of other diseases of the female genital tract: Z87.42

## 2015-03-09 HISTORY — DX: Presence of spectacles and contact lenses: Z97.3

## 2015-03-09 HISTORY — PX: CYSTOSCOPY: SHX5120

## 2015-03-09 HISTORY — DX: Pelvic and perineal pain: R10.2

## 2015-03-09 LAB — CBC
HCT: 36.4 % (ref 36.0–46.0)
Hemoglobin: 11.7 g/dL — ABNORMAL LOW (ref 12.0–15.0)
MCH: 27.9 pg (ref 26.0–34.0)
MCHC: 32.1 g/dL (ref 30.0–36.0)
MCV: 86.7 fL (ref 78.0–100.0)
PLATELETS: 238 10*3/uL (ref 150–400)
RBC: 4.2 MIL/uL (ref 3.87–5.11)
RDW: 15.1 % (ref 11.5–15.5)
WBC: 6.8 10*3/uL (ref 4.0–10.5)

## 2015-03-09 SURGERY — SALPINGO-OOPHORECTOMY, LAPAROSCOPIC
Anesthesia: General | Site: Bladder

## 2015-03-09 MED ORDER — FENTANYL CITRATE (PF) 100 MCG/2ML IJ SOLN
INTRAMUSCULAR | Status: DC | PRN
  Administered 2015-03-09 (×4): 50 ug via INTRAVENOUS

## 2015-03-09 MED ORDER — ONDANSETRON HCL 4 MG/2ML IJ SOLN
INTRAMUSCULAR | Status: DC | PRN
  Administered 2015-03-09: 4 mg via INTRAVENOUS

## 2015-03-09 MED ORDER — GLYCOPYRROLATE 0.2 MG/ML IJ SOLN
INTRAMUSCULAR | Status: DC | PRN
  Administered 2015-03-09: 0.4 mg via INTRAVENOUS

## 2015-03-09 MED ORDER — LIDOCAINE HCL 4 % MT SOLN
OROMUCOSAL | Status: DC | PRN
  Administered 2015-03-09: 2 mL via TOPICAL

## 2015-03-09 MED ORDER — LIDOCAINE HCL (CARDIAC) 20 MG/ML IV SOLN
INTRAVENOUS | Status: DC | PRN
  Administered 2015-03-09: 60 mg via INTRAVENOUS

## 2015-03-09 MED ORDER — SUCCINYLCHOLINE CHLORIDE 20 MG/ML IJ SOLN
INTRAMUSCULAR | Status: DC | PRN
  Administered 2015-03-09: 100 mg via INTRAVENOUS

## 2015-03-09 MED ORDER — FENTANYL CITRATE (PF) 100 MCG/2ML IJ SOLN
INTRAMUSCULAR | Status: AC
  Filled 2015-03-09: qty 2

## 2015-03-09 MED ORDER — FLUORESCEIN SODIUM 10 % IJ SOLN
INTRAMUSCULAR | Status: DC | PRN
  Administered 2015-03-09: 5 mL via INTRAVENOUS

## 2015-03-09 MED ORDER — HYDROMORPHONE HCL 2 MG PO TABS
2.0000 mg | ORAL_TABLET | Freq: Four times a day (QID) | ORAL | Status: DC | PRN
  Administered 2015-03-09: 2 mg via ORAL
  Filled 2015-03-09: qty 1

## 2015-03-09 MED ORDER — LACTATED RINGERS IR SOLN
Status: DC | PRN
  Administered 2015-03-09: 3000 mL

## 2015-03-09 MED ORDER — FENTANYL CITRATE (PF) 100 MCG/2ML IJ SOLN
25.0000 ug | INTRAMUSCULAR | Status: DC | PRN
  Administered 2015-03-09 (×2): 50 ug via INTRAVENOUS
  Filled 2015-03-09: qty 1

## 2015-03-09 MED ORDER — HYDROCORTISONE NA SUCCINATE PF 100 MG IJ SOLR
INTRAMUSCULAR | Status: DC | PRN
  Administered 2015-03-09: 100 mg via INTRAVENOUS

## 2015-03-09 MED ORDER — ROCURONIUM BROMIDE 100 MG/10ML IV SOLN
INTRAVENOUS | Status: DC | PRN
  Administered 2015-03-09: 5 mg via INTRAVENOUS
  Administered 2015-03-09: 20 mg via INTRAVENOUS

## 2015-03-09 MED ORDER — HYDROMORPHONE HCL 2 MG PO TABS
ORAL_TABLET | ORAL | Status: AC
  Filled 2015-03-09: qty 1

## 2015-03-09 MED ORDER — NEOSTIGMINE METHYLSULFATE 10 MG/10ML IV SOLN
INTRAVENOUS | Status: DC | PRN
  Administered 2015-03-09: 3 mg via INTRAVENOUS

## 2015-03-09 MED ORDER — OXYCODONE-ACETAMINOPHEN 7.5-325 MG PO TABS: 1.0000 | ORAL_TABLET | ORAL | Status: DC | PRN

## 2015-03-09 MED ORDER — MIDAZOLAM HCL 5 MG/5ML IJ SOLN
INTRAMUSCULAR | Status: DC | PRN
Start: 2015-03-09 — End: 2015-03-09
  Administered 2015-03-09: 2 mg via INTRAVENOUS

## 2015-03-09 MED ORDER — STERILE WATER FOR IRRIGATION IR SOLN
Status: DC | PRN
  Administered 2015-03-09: 3000 mL

## 2015-03-09 MED ORDER — LACTATED RINGERS IV SOLN
INTRAVENOUS | Status: DC
Start: 2015-03-09 — End: 2015-03-09
  Administered 2015-03-09 (×2): via INTRAVENOUS
  Filled 2015-03-09: qty 1000

## 2015-03-09 MED ORDER — BUPIVACAINE HCL 0.5 % IJ SOLN
INTRAMUSCULAR | Status: DC | PRN
  Administered 2015-03-09: 10 mL

## 2015-03-09 MED ORDER — BUPIVACAINE HCL 0.25 % IJ SOLN
INTRAMUSCULAR | Status: DC | PRN
  Administered 2015-03-09: 11 mL

## 2015-03-09 MED ORDER — MIDAZOLAM HCL 2 MG/2ML IJ SOLN
INTRAMUSCULAR | Status: AC
  Filled 2015-03-09: qty 2

## 2015-03-09 MED ORDER — HYDROMORPHONE HCL 2 MG PO TABS: 2.0000 mg | ORAL_TABLET | ORAL | Status: DC | PRN

## 2015-03-09 MED ORDER — ACETAMINOPHEN 10 MG/ML IV SOLN
INTRAVENOUS | Status: DC | PRN
  Administered 2015-03-09: 1000 mg via INTRAVENOUS

## 2015-03-09 MED ORDER — PROMETHAZINE HCL 25 MG/ML IJ SOLN
6.2500 mg | INTRAMUSCULAR | Status: DC | PRN
  Filled 2015-03-09: qty 1

## 2015-03-09 MED ORDER — DEXAMETHASONE SODIUM PHOSPHATE 4 MG/ML IJ SOLN
INTRAMUSCULAR | Status: DC | PRN
Start: 2015-03-09 — End: 2015-03-09
  Administered 2015-03-09: 10 mg via INTRAVENOUS

## 2015-03-09 MED ORDER — PROPOFOL 10 MG/ML IV BOLUS
INTRAVENOUS | Status: DC | PRN
Start: 2015-03-09 — End: 2015-03-09
  Administered 2015-03-09: 200 mg via INTRAVENOUS

## 2015-03-09 MED ORDER — CEFAZOLIN SODIUM-DEXTROSE 2-3 GM-% IV SOLR
INTRAVENOUS | Status: AC
  Filled 2015-03-09: qty 50

## 2015-03-09 MED ORDER — FENTANYL CITRATE (PF) 100 MCG/2ML IJ SOLN
INTRAMUSCULAR | Status: AC
  Filled 2015-03-09: qty 8

## 2015-03-09 MED ORDER — CEFAZOLIN SODIUM-DEXTROSE 2-3 GM-% IV SOLR
2.0000 g | INTRAVENOUS | Status: AC
  Administered 2015-03-09: 2 g via INTRAVENOUS
  Filled 2015-03-09: qty 50

## 2015-03-09 SURGICAL SUPPLY — 59 items
APPLICATOR COTTON TIP 6IN STRL (MISCELLANEOUS) ×3 IMPLANT
BAG SPEC RTRVL LRG 6X4 10 (ENDOMECHANICALS) ×4
BAG URINE DRAINAGE (UROLOGICAL SUPPLIES) IMPLANT
BANDAGE ADH SHEER 1  50/CT (GAUZE/BANDAGES/DRESSINGS) ×3 IMPLANT
BANDAGE ADHESIVE 1X3 (GAUZE/BANDAGES/DRESSINGS) IMPLANT
BLADE SURG 11 STRL SS (BLADE) ×3 IMPLANT
CANISTER SUCTION 1200CC (MISCELLANEOUS) IMPLANT
CANISTER SUCTION 2500CC (MISCELLANEOUS) IMPLANT
CATH FOLEY 2WAY SLVR  5CC 14FR (CATHETERS)
CATH FOLEY 2WAY SLVR 5CC 14FR (CATHETERS) IMPLANT
CATH ROBINSON RED A/P 16FR (CATHETERS) ×3 IMPLANT
COVER MAYO STAND STRL (DRAPES) ×6 IMPLANT
DRSG TELFA 3X8 NADH (GAUZE/BANDAGES/DRESSINGS) IMPLANT
ELECT REM PT RETURN 9FT ADLT (ELECTROSURGICAL)
ELECTRODE REM PT RTRN 9FT ADLT (ELECTROSURGICAL) ×1 IMPLANT
FILTER SMOKE EVAC LAPAROSHD (FILTER) IMPLANT
GLOVE BIO SURGEON STRL SZ 6.5 (GLOVE) ×3 IMPLANT
GLOVE BIO SURGEON STRL SZ7 (GLOVE) ×6 IMPLANT
GLOVE BIOGEL PI IND STRL 6.5 (GLOVE) ×1 IMPLANT
GLOVE BIOGEL PI IND STRL 7.5 (GLOVE) ×1 IMPLANT
GLOVE BIOGEL PI INDICATOR 6.5 (GLOVE) ×1
GLOVE BIOGEL PI INDICATOR 7.5 (GLOVE) ×1
GOWN STRL REUS W/ TWL LRG LVL3 (GOWN DISPOSABLE) ×4 IMPLANT
GOWN STRL REUS W/TWL LRG LVL3 (GOWN DISPOSABLE) ×6
IV LACTATED RINGER IRRG 3000ML (IV SOLUTION) ×3
IV LR IRRIG 3000ML ARTHROMATIC (IV SOLUTION) ×2 IMPLANT
LAPAROSCOPY HANDPIECE LONG (MISCELLANEOUS) IMPLANT
LIQUID BAND (GAUZE/BANDAGES/DRESSINGS) ×3 IMPLANT
MANIFOLD NEPTUNE II (INSTRUMENTS) IMPLANT
NEEDLE HYPO 22GX1.5 SAFETY (NEEDLE) ×6 IMPLANT
NEEDLE INSUFFLATION 14GA 120MM (NEEDLE) ×3 IMPLANT
NS IRRIG 500ML POUR BTL (IV SOLUTION) ×3 IMPLANT
PACK BASIN DAY SURGERY FS (CUSTOM PROCEDURE TRAY) ×3 IMPLANT
PACK LAPAROSCOPY II (CUSTOM PROCEDURE TRAY) ×3 IMPLANT
PAD OB MATERNITY 4.3X12.25 (PERSONAL CARE ITEMS) ×3 IMPLANT
PAD PREP 24X48 CUFFED NSTRL (MISCELLANEOUS) ×3 IMPLANT
PADDING ION DISPOSABLE (MISCELLANEOUS) ×3 IMPLANT
POUCH SPECIMEN RETRIEVAL 10MM (ENDOMECHANICALS) ×6 IMPLANT
SCISSORS LAP 5X35 DISP (ENDOMECHANICALS) IMPLANT
SEALER TISSUE G2 CVD JAW 35 (ENDOMECHANICALS) IMPLANT
SEALER TISSUE G2 CVD JAW 45CM (ENDOMECHANICALS) ×3 IMPLANT
SET IRRIG TUBING LAPAROSCOPIC (IRRIGATION / IRRIGATOR) ×2 IMPLANT
SOLUTION ANTI FOG 6CC (MISCELLANEOUS) ×3 IMPLANT
SUT VIC AB 3-0 PS2 18 (SUTURE) ×3
SUT VIC AB 3-0 PS2 18XBRD (SUTURE) ×2 IMPLANT
SUT VICRYL 0 ENDOLOOP (SUTURE) IMPLANT
SUT VICRYL 0 UR6 27IN ABS (SUTURE) IMPLANT
SYR CONTROL 10ML LL (SYRINGE) ×4 IMPLANT
SYRINGE 10CC LL (SYRINGE) IMPLANT
TOWEL OR 17X24 6PK STRL BLUE (TOWEL DISPOSABLE) ×6 IMPLANT
TRAY DSU PREP LF (CUSTOM PROCEDURE TRAY) ×3 IMPLANT
TROCAR BALLN 12MMX100 BLUNT (TROCAR) IMPLANT
TROCAR XCEL DIL TIP R 11M (ENDOMECHANICALS) ×6 IMPLANT
TROCAR Z-THREAD BLADED 11X100M (TROCAR) IMPLANT
TROCAR Z-THREAD BLADED 5X100MM (TROCAR) IMPLANT
TUBING INSUFFLATION 10FT LAP (TUBING) ×3 IMPLANT
VACUUM HOSE/TUBING 7/8INX6FT (MISCELLANEOUS) IMPLANT
WATER STERILE IRR 3000ML UROMA (IV SOLUTION) ×3 IMPLANT
WATER STERILE IRR 500ML POUR (IV SOLUTION) ×3 IMPLANT

## 2015-03-09 NOTE — Op Note (Signed)
NAMESABRE, ROMBERGER NO.:  [id number]  MEDICAL RECORD NO.:  [id number]  LOCATION:                                 FACILITY:  PHYSICIAN:  Juluis Mire, M.D.        DATE OF BIRTH:  DATE OF PROCEDURE:  03/09/2015 DATE OF DISCHARGE:                              OPERATIVE REPORT   PREOPERATIVE DIAGNOSIS:  Chronic pelvic pain and dyspareunia.  POSTOPERATIVE DIAGNOSIS:  Chronic pelvic pain and dyspareunia with some evidence of pelvic adhesions.  OPERATIVE PROCEDURE:  Laparoscopic bilateral salpingo-oophorectomy. Injection of the vaginal cuff with steroids and Marcaine.  Cystoscopy.  SURGEON:  Juluis Mire, M.D.  ANESTHESIA:  General endotracheal.  BLOOD LOSS:  Minimal.  PACKS:  None.  DRAINS:  None.  INTRAOPERATIVE BLOOD PLACED:  None.  COMPLICATIONS:  None.  INDICATIONS:  As dictated in the History and Physical.  DESCRIPTION OF PROCEDURE:  The patient was taken to the OR and was placed in a supine position.  After satisfactory level of general endotracheal anesthesia had been obtained, the patient was placed in the dorsal lithotomy position using the Allen stirrups.  The abdomen, perineum, and vagina were prepped out with Betadine.  Bladder was emptied by in and out catheterization.  The patient was draped in sterile field.  A subumbilical incision made with a knife.  Veress needle was introduced in the abdominal cavity.  Abdomen was inflated with approximately 3 L of carbon dioxide.  A 10/11 trocar was then inserted along with a laparoscope.  There was no evidence of injury to adjacent organs.  A 5-mm trocar was put in place in the suprapubic area. Visualizations revealed some adhesions to the left ovary from the epiploic of the sigmoid colon.  The right ovary was completely free. The cul-de-sac was clear.  Upper abdomen including the liver tip and the gallbladder and both lateral gutters were clear.  I could not see the appendix that may have  been retrocecal.  At this point in time, we took out the suprapubic 5 mm trocar, extended the incision, put in a 10/11. A 5 mm trocar was then put in place in the left lower quadrant after visualization of the epigastric vessels in order to avoid them.  First, we went to the right side.  The right ovary was elevated.  We could clearly see the ureter along its course in the pelvic sidewall.  We isolated the ovarian vasculature above that using the EnSeal.  The ovarian vasculature was cauterized and incised.  The mesenteric attachments of the ovaries were cauterized and incised, and the ovary and tube were freed.  An Endobag was placed through the 10/11 trocar. The ovary was placed in the Endobag and brought out through the suprapubic incision.  The 10/11 trocar was then reinserted.  Next, the left ovary was elevated.  Using scissors, we were able to free the ovary from these adhesions to the epiploic.  There was no evidence of injury to the bowel.  Again, we could isolate the ovarian vasculature above what appeared to be the ureter.  Using the EnSeal, the ovarian vasculature was cauterized and incised.  The mesenteric attachments  of the ovaries were then cauterized and incised.  An Endo Catch pouch was put in through the suprapubic incisions.  The left tube and ovary were placed in the pouch and removed through the suprapubic area.  A 10/11 trocar was then reinserted.  We had some oozing from the left side. This was brought under control with the bipolar.  Right side was hemostatically intact.  We thoroughly irrigated the pelvis.  We removed the irrigation and de-inflated the abdomen.  No bleeding was encountered.  After, the abdomen was reinflated.  At this point in time, the abdomen was completely deflated.  All trocars were removed. Subumbilical incision was closed with interrupted subcuticulars of 4-0 Vicryl.  Suprapubic incisions were closed with Dermabond.  A spec was placed in the  vaginal vault.  The whole vaginal cuff was injected with 10 mL of 0.5% Marcaine and Solu-Cortef.  Next, cystoscopy was performed.  There was no evidence of injury to the bladder.  The patient being given fluorescein.  Both ureteral orifices were identified, noted to have green tinged jets coming from each one.  The cystoscope was then removed after completely draining the bladder.  At this point in time, the patient was taken out of the dorsal lithotomy position.  Sponge, instrument, and needle count was correct by circulating nurse x2.  The patient once extubated and alert, was transferred to recovery room in good condition.     Juluis Mire, M.D.     JSM/MEDQ  D:  03/09/2015  T:  03/09/2015  Job:  [id number]

## 2015-03-09 NOTE — Anesthesia Procedure Notes (Signed)
Procedure Name: Intubation Date/Time: 03/09/2015 7:31 AM Performed by: Mechele Claude Pre-anesthesia Checklist: Patient identified, Emergency Drugs available, Suction available and Patient being monitored Patient Re-evaluated:Patient Re-evaluated prior to inductionOxygen Delivery Method: Circle System Utilized Preoxygenation: Pre-oxygenation with 100% oxygen Intubation Type: IV induction and Cricoid Pressure applied Ventilation: Mask ventilation without difficulty Laryngoscope Size: Mac and 3 Grade View: Grade II Tube type: Oral Tube size: 7.0 mm Number of attempts: 1 Airway Equipment and Method: Stylet and LTA kit utilized Placement Confirmation: ETT inserted through vocal cords under direct vision,  positive ETCO2 and breath sounds checked- equal and bilateral Secured at: 21 cm Tube secured with: Tape Dental Injury: Teeth and Oropharynx as per pre-operative assessment

## 2015-03-09 NOTE — Progress Notes (Signed)
Patient ID: [name] [name], female   DOB: 06-Oct-1965, 49 y.o.   MRN: [id number] History and physical exam unchanged

## 2015-03-09 NOTE — Brief Op Note (Signed)
03/09/2015  8:43 AM  PATIENT:  [name] [name]  49 y.o. female  PRE-OPERATIVE DIAGNOSIS:  PELVIC PAIN  POST-OPERATIVE DIAGNOSIS:  PELVIC PAIN  PROCEDURE:  Procedure(s): LAPAROSCOPIC SALPINGO OOPHORECTOMY (Bilateral) CYSTOSCOPY WITH INJECTION OF VAGINAL CUFF (N/A)  SURGEON:  Surgeon(s) and Role:    * Richardean Chimera, MD - Primary  PHYSICIAN ASSISTANT:   ASSISTANTS: none   ANESTHESIA:   local and general  EBL:  Total I/O In: 1000 [I.V.:1000] Out: -   BLOOD ADMINISTERED:none  DRAINS: none   LOCAL MEDICATIONS USED:  MARCAINE     SPECIMEN:  Source of Specimen:  both tubes and ovaries  DISPOSITION OF SPECIMEN:  PATHOLOGY  COUNTS:  YES  TOURNIQUET:  * No tourniquets in log *  DICTATION: .Other Dictation: Dictation Number [id number]  PLAN OF CARE: Discharge to home after PACU  PATIENT DISPOSITION:  PACU - hemodynamically stable.   Delay start of Pharmacological VTE agent (>24hrs) due to surgical blood loss or risk of bleeding: not applicable

## 2015-03-09 NOTE — Discharge Instructions (Signed)
CYSTOSCOPY HOME CARE INSTRUCTIONS  Activity: Rest for the remainder of the day.  Do not drive or operate equipment today.  You may resume normal activities in one to two days as instructed by your physician.   Meals: Drink plenty of liquids and eat light foods such as gelatin or soup this evening.  You may return to a normal meal plan tomorrow.  Return to Work: You may return to work in one to two days or as instructed by your physician.  Special Instructions / Symptoms: Call your physician if any of these symptoms occur:   -persistent or heavy bleeding  -bleeding which continues after first few urination  -large blood clots that are difficult to pass  -urine stream diminishes or stops completely  -fever equal to or higher than 101 degrees Farenheit.  -cloudy urine with a strong, foul odor  -severe pain  Females should always wipe from front to back after elimination.  You may feel some burning pain when you urinate.  This should disappear with time.  Applying moist heat to the lower abdomen or a hot tub bath may help relieve the pain. \  Follow-Up / Date of Return Visit to Your Physician:   Call for an appointment to arrange follow-up.    Post Anesthesia Home Care Instructions  Activity: Get plenty of rest for the remainder of the day. A responsible adult should stay with you for 24 hours following the procedure.  For the next 24 hours, DO NOT: -Drive a car -Advertising copywriter -Drink alcoholic beverages -Take any medication unless instructed by your physician -Make any legal decisions or sign important papers.  Meals: Start with liquid foods such as gelatin or soup. Progress to regular foods as tolerated. Avoid greasy, spicy, heavy foods. If nausea and/or vomiting occur, drink only clear liquids until the nausea and/or vomiting subsides. Call your physician if vomiting continues.  Special Instructions/Symptoms: Your throat may feel dry or sore from the anesthesia or the  breathing tube placed in your throat during surgery. If this causes discomfort, gargle with warm salt water. The discomfort should disappear within 24 hours.  If you had a scopolamine patch placed behind your ear for the management of post- operative nausea and/or vomiting:  1. The medication in the patch is effective for 72 hours, after which it should be removed.  Wrap patch in a tissue and discard in the trash. Wash hands thoroughly with soap and water. 2. You may remove the patch earlier than 72 hours if you experience unpleasant side effects which may include dry mouth, dizziness or visual disturbances. 3. Avoid touching the patch. Wash your hands with soap and water after contact with the patch.    Bilateral Salpingo-Oophorectomy, Care After Refer to this sheet in the next few weeks. These instructions provide you with information on caring for yourself after your procedure. Your health care provider may also give you more specific instructions. Your treatment has been planned according to current medical practices, but problems sometimes occur. Call your health care provider if you have any problems or questions after your procedure. WHAT TO EXPECT AFTER THE PROCEDURE After your procedure, it is typical to have the following:   Abdominal pain that can be controlled with medicine.  Vaginal spotting.  Constipation.  Menopausal symptoms such as hot flashes, vaginal dryness, and mood swings. HOME CARE INSTRUCTIONS   Get plenty of rest and sleep.  Only take over-the-counter or prescription medicines as directed by your health care provider. Do not  take aspirin. It can cause bleeding.  Keep incision areas clean and dry. Remove or change bandages (dressings) only as directed by your health care provider.  Take showers instead of baths for a few weeks as directed by your health care provider.  Limit exercise and activities as directed by your health care provider. Do not lift anything  heavier than 5 pounds (2.3 kg) until your health care provider approves.  Do not drive until your health care provider approves.  Follow your health care provider's advice regarding diet. You may be able to resume your usual diet right away.  Drink enough fluids to keep your urine clear or pale yellow.  Do not douche, use tampons, or have sexual intercourse for 6 weeks after the procedure.  Do not drink alcohol until your health care provider says it is okay.  Take your temperature twice a day and write it down.  If you become constipated, you may:  Ask your health care provider about taking a mild laxative.  Add more fruit and bran to your diet.  Drink more fluids.  Follow up with your health care provider as directed. SEEK MEDICAL CARE IF:   You have swelling, redness, or increasing pain in the incision area.  You see pus coming from the incision area.  You notice a bad smell coming from the wound or dressing.  You have pain, redness, or swelling where the IV access tube was placed.  Your incision is breaking open (the edges are not staying together).  You feel dizzy or feel like fainting.  You develop pain or bleeding when you urinate.  You develop diarrhea.  You develop nausea and vomiting.  You develop abnormal vaginal discharge.  You develop a rash.  You have pain that is not controlled with medicine. SEEK IMMEDIATE MEDICAL CARE IF:   You develop a fever.  You develop abdominal pain.  You have chest pain.  You develop shortness of breath.  You pass out.  You develop pain, swelling, or redness in your leg.  You develop heavy vaginal bleeding with or without blood clots. Document Released: 07/14/2005 Document Revised: 03/16/2013 Document Reviewed: 01/05/2013 [facility] Patient Information 2015 [place], [place]. This information is not intended to replace advice given to you by your health care provider. Make sure you discuss any questions you have with  your health care provider.

## 2015-03-09 NOTE — Transfer of Care (Signed)
Last Vitals:  Filed Vitals:   03/09/15 0611  BP: 112/70  Pulse: 73  Temp: 36.5 C  Resp: 16   Immediate Anesthesia Transfer of Care Note  Patient: [name] [name]  Procedure(s) Performed: Procedure(s) (LRB): LAPAROSCOPIC SALPINGO OOPHORECTOMY (Bilateral) CYSTOSCOPY WITH INJECTION OF VAGINAL CUFF (N/A)  Patient Location: PACU  Anesthesia Type: General  Level of Consciousness: awake, alert  and oriented  Airway & Oxygen Therapy: Patient Spontanous Breathing and Patient connected to face mask oxygen  Post-op Assessment: Report given to PACU RN and Post -op Vital signs reviewed and stable  Post vital signs: Reviewed and stable  Complications: No apparent anesthesia complications
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
## 2015-03-09 NOTE — Anesthesia Postprocedure Evaluation (Signed)
  Anesthesia Post-op Note  Patient: [name] [name]  Procedure(s) Performed: Procedure(s) (LRB): LAPAROSCOPIC SALPINGO OOPHORECTOMY (Bilateral) CYSTOSCOPY WITH INJECTION OF VAGINAL CUFF (N/A)  Patient Location: PACU  Anesthesia Type: General  Level of Consciousness: awake and alert   Airway and Oxygen Therapy: Patient Spontanous Breathing  Post-op Pain: mild  Post-op Assessment: Post-op Vital signs reviewed, Patient's Cardiovascular Status Stable, Respiratory Function Stable, Patent Airway and No signs of Nausea or vomiting  Last Vitals:  Filed Vitals:   03/09/15 1108  BP: 104/62  Pulse: 57  Temp: 36.6 C  Resp: 14    Post-op Vital Signs: stable   Complications: No apparent anesthesia complications

## 2015-03-12 ENCOUNTER — Encounter (HOSPITAL_BASED_OUTPATIENT_CLINIC_OR_DEPARTMENT_OTHER): Payer: Self-pay | Admitting: Obstetrics and Gynecology

## 2015-05-23 ENCOUNTER — Encounter (HOSPITAL_COMMUNITY): Payer: Self-pay | Admitting: *Deleted

## 2015-05-23 MED ORDER — CEFAZOLIN SODIUM-DEXTROSE 2-3 GM-% IV SOLR
2.0000 g | INTRAVENOUS | Status: AC
  Administered 2015-05-24: 2 g via INTRAVENOUS
  Filled 2015-05-23: qty 50

## 2015-05-23 NOTE — Progress Notes (Signed)
Pt denies SOB, chest pain, and being under the care of a cardiologist. Pt denies having a stress test, echo and cardiac cath. Pt denies having a chest x ray but stated that an [facility] was [facility], PCP, Dr. Lois HuxleyJames Davis; records requested. Pt made aware to stop  taking Aspirin,otc vitamins and herbal medications. Do not take any NSAIDs ie: Ibuprofen, Advil, Naproxen or any medication containing Aspirin. Pt verbalized understanding of all pre-op instructions.

## 2015-05-24 ENCOUNTER — Encounter (HOSPITAL_COMMUNITY): Payer: Self-pay | Admitting: *Deleted

## 2015-05-24 ENCOUNTER — Ambulatory Visit (HOSPITAL_COMMUNITY): Payer: BLUE CROSS/BLUE SHIELD

## 2015-05-24 ENCOUNTER — Encounter (HOSPITAL_COMMUNITY)
Admission: RE | Disposition: A | Discharge status: Home / Self Care | Payer: Self-pay | Source: Ambulatory Visit | Attending: Orthopedic Surgery

## 2015-05-24 ENCOUNTER — Ambulatory Visit (HOSPITAL_COMMUNITY): Payer: BLUE CROSS/BLUE SHIELD | Admitting: Anesthesiology

## 2015-05-24 ENCOUNTER — Observation Stay (HOSPITAL_COMMUNITY): Payer: BLUE CROSS/BLUE SHIELD

## 2015-05-24 ENCOUNTER — Observation Stay (HOSPITAL_COMMUNITY)
Admission: RE | Admit: 2015-05-24 | Discharge: 2015-05-25 | Disposition: A | Discharge status: Home / Self Care | Payer: BLUE CROSS/BLUE SHIELD | Source: Ambulatory Visit | Attending: Orthopedic Surgery | Admitting: Orthopedic Surgery

## 2015-05-24 DIAGNOSIS — Z90710 Acquired absence of both cervix and uterus: Secondary | ICD-10-CM | POA: Insufficient documentation

## 2015-05-24 DIAGNOSIS — Z981 Arthrodesis status: Secondary | ICD-10-CM | POA: Diagnosis not present

## 2015-05-24 DIAGNOSIS — M50123 Cervical disc disorder at C6-C7 level with radiculopathy: Secondary | ICD-10-CM | POA: Diagnosis not present

## 2015-05-24 DIAGNOSIS — M542 Cervicalgia: Secondary | ICD-10-CM | POA: Diagnosis present

## 2015-05-24 DIAGNOSIS — Z419 Encounter for procedure for purposes other than remedying health state, unspecified: Secondary | ICD-10-CM

## 2015-05-24 HISTORY — PX: ANTERIOR CERVICAL DECOMP/DISCECTOMY FUSION: SHX1161

## 2015-05-24 HISTORY — DX: Other complications of anesthesia, initial encounter: T88.59XA

## 2015-05-24 HISTORY — DX: Cervicalgia: M54.2

## 2015-05-24 HISTORY — DX: Other cervical disc degeneration, unspecified cervical region: M50.30

## 2015-05-24 HISTORY — PX: CERVICAL DISC SURGERY: SHX588

## 2015-05-24 HISTORY — DX: Anemia, unspecified: D64.9

## 2015-05-24 HISTORY — DX: Adverse effect of unspecified anesthetic, initial encounter: T41.45XA

## 2015-05-24 HISTORY — PX: HARDWARE REMOVAL: SHX979

## 2015-05-24 HISTORY — DX: Other chronic pain: G89.29

## 2015-05-24 LAB — BASIC METABOLIC PANEL
Anion gap: 9 (ref 5–15)
BUN: 9 mg/dL (ref 6–20)
CO2: 27 mmol/L (ref 22–32)
Calcium: 9.8 mg/dL (ref 8.9–10.3)
Chloride: 107 mmol/L (ref 101–111)
Creatinine, Ser: 0.74 mg/dL (ref 0.44–1.00)
GFR calc non Af Amer: >60 mL/min (ref >60)
Glucose, Bld: 89 mg/dL (ref 65–99)
Potassium: 3.7 mmol/L (ref 3.5–5.1)
Sodium: 143 mmol/L (ref 135–145)

## 2015-05-24 LAB — CBC
HCT: 38.8 % (ref 36.0–46.0)
HEMOGLOBIN: 12.5 g/dL (ref 12.0–15.0)
MCH: 28.3 pg (ref 26.0–34.0)
MCHC: 32.2 g/dL (ref 30.0–36.0)
MCV: 87.8 fL (ref 78.0–100.0)
Platelets: 259 10*3/uL (ref 150–400)
RBC: 4.42 MIL/uL (ref 3.87–5.11)
RDW: 14.9 % (ref 11.5–15.5)
WBC: 7.1 10*3/uL (ref 4.0–10.5)

## 2015-05-24 LAB — SURGICAL PCR SCREEN
MRSA, PCR: NEGATIVE
STAPHYLOCOCCUS AUREUS: POSITIVE — AB

## 2015-05-24 SURGERY — ANTERIOR CERVICAL DECOMPRESSION/DISCECTOMY FUSION 1 LEVEL
Anesthesia: General | Site: Neck

## 2015-05-24 MED ORDER — SODIUM CHLORIDE 0.9 % IJ SOLN: 3.0000 mL | Freq: Two times a day (BID) | INTRAMUSCULAR | Status: DC

## 2015-05-24 MED ORDER — LACTATED RINGERS IV SOLN
INTRAVENOUS | Status: DC
  Administered 2015-05-24: 22:00:00 via INTRAVENOUS

## 2015-05-24 MED ORDER — METHOCARBAMOL 500 MG PO TABS
500.0000 mg | ORAL_TABLET | Freq: Four times a day (QID) | ORAL | Status: DC | PRN
  Administered 2015-05-24 – 2015-05-25 (×2): 500 mg via ORAL
  Filled 2015-05-24: qty 1

## 2015-05-24 MED ORDER — FENTANYL CITRATE (PF) 250 MCG/5ML IJ SOLN
INTRAMUSCULAR | Status: AC
Start: 2015-05-24 — End: 2015-05-24
  Filled 2015-05-24: qty 5

## 2015-05-24 MED ORDER — MENTHOL 3 MG MT LOZG: 1.0000 | LOZENGE | OROMUCOSAL | Status: DC | PRN

## 2015-05-24 MED ORDER — HEMOSTATIC AGENTS (NO CHARGE) OPTIME
TOPICAL | Status: DC | PRN
  Administered 2015-05-24: 1 via TOPICAL

## 2015-05-24 MED ORDER — ACETAMINOPHEN 10 MG/ML IV SOLN
INTRAVENOUS | Status: DC | PRN
  Administered 2015-05-24: 1000 mg via INTRAVENOUS

## 2015-05-24 MED ORDER — DEXAMETHASONE 4 MG PO TABS: 4.0000 mg | ORAL_TABLET | Freq: Four times a day (QID) | ORAL | Status: AC

## 2015-05-24 MED ORDER — CEFAZOLIN SODIUM 1-5 GM-% IV SOLN
1.0000 g | Freq: Three times a day (TID) | INTRAVENOUS | Status: AC
  Administered 2015-05-24 – 2015-05-25 (×2): 1 g via INTRAVENOUS
  Filled 2015-05-24 (×2): qty 50

## 2015-05-24 MED ORDER — ONDANSETRON HCL 4 MG/2ML IJ SOLN: 4.0000 mg | INTRAMUSCULAR | Status: DC | PRN

## 2015-05-24 MED ORDER — PROPOFOL 10 MG/ML IV BOLUS
INTRAVENOUS | Status: AC
  Filled 2015-05-24: qty 20

## 2015-05-24 MED ORDER — DEXAMETHASONE SODIUM PHOSPHATE 4 MG/ML IJ SOLN
4.0000 mg | Freq: Four times a day (QID) | INTRAMUSCULAR | Status: AC
  Administered 2015-05-24 – 2015-05-25 (×3): 4 mg via INTRAVENOUS
  Filled 2015-05-24 (×3): qty 1

## 2015-05-24 MED ORDER — MEPERIDINE HCL 25 MG/ML IJ SOLN: 6.2500 mg | INTRAMUSCULAR | Status: DC | PRN

## 2015-05-24 MED ORDER — PROMETHAZINE HCL 25 MG/ML IJ SOLN
6.2500 mg | INTRAMUSCULAR | Status: DC | PRN
Start: 2015-05-24 — End: 2015-05-24

## 2015-05-24 MED ORDER — ARTIFICIAL TEARS OP OINT
TOPICAL_OINTMENT | OPHTHALMIC | Status: DC | PRN
  Administered 2015-05-24: 1 via OPHTHALMIC

## 2015-05-24 MED ORDER — PROPOFOL 10 MG/ML IV BOLUS
INTRAVENOUS | Status: DC | PRN
  Administered 2015-05-24: 200 mg via INTRAVENOUS

## 2015-05-24 MED ORDER — BUPIVACAINE-EPINEPHRINE 0.25% -1:200000 IJ SOLN
INTRAMUSCULAR | Status: DC | PRN
  Administered 2015-05-24: 7 mL

## 2015-05-24 MED ORDER — VECURONIUM BROMIDE 10 MG IV SOLR
INTRAVENOUS | Status: DC | PRN
  Administered 2015-05-24: 2 mg via INTRAVENOUS

## 2015-05-24 MED ORDER — HYDROMORPHONE HCL 1 MG/ML IJ SOLN
0.2500 mg | INTRAMUSCULAR | Status: DC | PRN
  Administered 2015-05-24 (×4): 0.5 mg via INTRAVENOUS

## 2015-05-24 MED ORDER — HYDROMORPHONE HCL 1 MG/ML IJ SOLN
INTRAMUSCULAR | Status: AC
  Administered 2015-05-24: 0.5 mg via INTRAVENOUS
  Filled 2015-05-24: qty 1

## 2015-05-24 MED ORDER — SODIUM CHLORIDE 0.9 % IJ SOLN: 3.0000 mL | INTRAMUSCULAR | Status: DC | PRN

## 2015-05-24 MED ORDER — NEOSTIGMINE METHYLSULFATE 10 MG/10ML IV SOLN
INTRAVENOUS | Status: DC | PRN
Start: 2015-05-24 — End: 2015-05-24
  Administered 2015-05-24: 2.5 mg via INTRAVENOUS

## 2015-05-24 MED ORDER — MIDAZOLAM HCL 2 MG/2ML IJ SOLN
INTRAMUSCULAR | Status: AC
  Filled 2015-05-24: qty 4

## 2015-05-24 MED ORDER — SODIUM CHLORIDE 0.9 % IV SOLN: 250.0000 mL | INTRAVENOUS | Status: DC

## 2015-05-24 MED ORDER — 0.9 % SODIUM CHLORIDE (POUR BTL) OPTIME
TOPICAL | Status: DC | PRN
  Administered 2015-05-24: 1000 mL

## 2015-05-24 MED ORDER — OXYCODONE HCL 5 MG PO TABS
ORAL_TABLET | ORAL | Status: AC
  Filled 2015-05-24: qty 2

## 2015-05-24 MED ORDER — SODIUM CHLORIDE 0.9 % IV SOLN
10.0000 mg | INTRAVENOUS | Status: DC | PRN
  Administered 2015-05-24: 15 ug/min via INTRAVENOUS

## 2015-05-24 MED ORDER — DEXAMETHASONE SODIUM PHOSPHATE 10 MG/ML IJ SOLN
INTRAMUSCULAR | Status: DC | PRN
  Administered 2015-05-24: 10 mg via INTRAVENOUS

## 2015-05-24 MED ORDER — ROCURONIUM BROMIDE 100 MG/10ML IV SOLN
INTRAVENOUS | Status: DC | PRN
  Administered 2015-05-24: 50 mg via INTRAVENOUS

## 2015-05-24 MED ORDER — DEXAMETHASONE SODIUM PHOSPHATE 10 MG/ML IJ SOLN
INTRAMUSCULAR | Status: AC
  Filled 2015-05-24: qty 1

## 2015-05-24 MED ORDER — LACTATED RINGERS IV SOLN
INTRAVENOUS | Status: DC
  Administered 2015-05-24: 11:00:00 via INTRAVENOUS

## 2015-05-24 MED ORDER — OXYCODONE HCL 5 MG PO TABS
10.0000 mg | ORAL_TABLET | ORAL | Status: DC | PRN
  Administered 2015-05-24 – 2015-05-25 (×5): 10 mg via ORAL
  Filled 2015-05-24 (×4): qty 2

## 2015-05-24 MED ORDER — DIPHENHYDRAMINE HCL 50 MG/ML IJ SOLN: 12.5000 mg | Freq: Once | INTRAMUSCULAR | Status: DC

## 2015-05-24 MED ORDER — LIDOCAINE HCL (CARDIAC) 20 MG/ML IV SOLN
INTRAVENOUS | Status: DC | PRN
  Administered 2015-05-24: 80 mg via INTRAVENOUS

## 2015-05-24 MED ORDER — THROMBIN 20000 UNITS EX SOLR
CUTANEOUS | Status: AC
  Filled 2015-05-24: qty 20000

## 2015-05-24 MED ORDER — LACTATED RINGERS IV SOLN
INTRAVENOUS | Status: DC | PRN
  Administered 2015-05-24 (×2): via INTRAVENOUS

## 2015-05-24 MED ORDER — ACETAMINOPHEN 10 MG/ML IV SOLN
INTRAVENOUS | Status: AC
  Filled 2015-05-24: qty 100

## 2015-05-24 MED ORDER — DIPHENHYDRAMINE HCL 12.5 MG/5ML PO LIQD
12.5000 mg | Freq: Four times a day (QID) | ORAL | Status: DC | PRN
  Filled 2015-05-24 (×2): qty 10

## 2015-05-24 MED ORDER — BUPIVACAINE-EPINEPHRINE (PF) 0.25% -1:200000 IJ SOLN
INTRAMUSCULAR | Status: AC
Start: 2015-05-24 — End: 2015-05-24
  Filled 2015-05-24: qty 30

## 2015-05-24 MED ORDER — MORPHINE SULFATE (PF) 2 MG/ML IV SOLN
1.0000 mg | INTRAVENOUS | Status: DC | PRN
  Administered 2015-05-24: 2 mg via INTRAVENOUS
  Filled 2015-05-24: qty 1

## 2015-05-24 MED ORDER — MUPIROCIN 2 % EX OINT
1.0000 "application " | TOPICAL_OINTMENT | Freq: Once | CUTANEOUS | Status: AC
  Administered 2015-05-24: 1 via TOPICAL
  Filled 2015-05-24: qty 22

## 2015-05-24 MED ORDER — MIDAZOLAM HCL 2 MG/2ML IJ SOLN
INTRAMUSCULAR | Status: AC
  Administered 2015-05-24: 1 mg via INTRAVENOUS
  Filled 2015-05-24: qty 2

## 2015-05-24 MED ORDER — EPHEDRINE SULFATE 50 MG/ML IJ SOLN
INTRAMUSCULAR | Status: DC | PRN
  Administered 2015-05-24: 5 mg via INTRAVENOUS

## 2015-05-24 MED ORDER — FENTANYL CITRATE (PF) 250 MCG/5ML IJ SOLN
INTRAMUSCULAR | Status: AC
  Filled 2015-05-24: qty 5

## 2015-05-24 MED ORDER — DIPHENHYDRAMINE HCL 50 MG/ML IJ SOLN
INTRAMUSCULAR | Status: AC
  Administered 2015-05-24: 12.5 mg
  Filled 2015-05-24: qty 1

## 2015-05-24 MED ORDER — METHOCARBAMOL 500 MG PO TABS
ORAL_TABLET | ORAL | Status: AC
  Filled 2015-05-24: qty 1

## 2015-05-24 MED ORDER — THROMBIN 20000 UNITS EX SOLR
OROMUCOSAL | Status: DC | PRN
  Administered 2015-05-24: 20 mL via TOPICAL

## 2015-05-24 MED ORDER — PHENOL 1.4 % MT LIQD
1.0000 | OROMUCOSAL | Status: DC | PRN
  Filled 2015-05-24: qty 177

## 2015-05-24 MED ORDER — METHOCARBAMOL 1000 MG/10ML IJ SOLN: 500.0000 mg | Freq: Four times a day (QID) | INTRAVENOUS | Status: DC | PRN

## 2015-05-24 MED ORDER — ONDANSETRON HCL 4 MG/2ML IJ SOLN
INTRAMUSCULAR | Status: DC | PRN
  Administered 2015-05-24: 4 mg via INTRAVENOUS

## 2015-05-24 MED ORDER — FENTANYL CITRATE (PF) 100 MCG/2ML IJ SOLN
INTRAMUSCULAR | Status: DC | PRN
  Administered 2015-05-24: 100 ug via INTRAVENOUS
  Administered 2015-05-24: 50 ug via INTRAVENOUS
  Administered 2015-05-24: 100 ug via INTRAVENOUS
  Administered 2015-05-24 (×2): 50 ug via INTRAVENOUS
  Administered 2015-05-24: 100 ug via INTRAVENOUS
  Administered 2015-05-24: 50 ug via INTRAVENOUS

## 2015-05-24 MED ORDER — MIDAZOLAM HCL 2 MG/2ML IJ SOLN
0.5000 mg | Freq: Once | INTRAMUSCULAR | Status: AC | PRN
  Administered 2015-05-24: 1 mg via INTRAVENOUS

## 2015-05-24 MED ORDER — GLYCOPYRROLATE 0.2 MG/ML IJ SOLN
INTRAMUSCULAR | Status: DC | PRN
  Administered 2015-05-24: 0.4 mg via INTRAVENOUS
  Administered 2015-05-24: 0.3 mg via INTRAVENOUS

## 2015-05-24 SURGICAL SUPPLY — 99 items
ADH SKN CLS APL DERMABOND .7 (GAUZE/BANDAGES/DRESSINGS)
BLADE SURG ROTATE 9660 (MISCELLANEOUS) IMPLANT
BUR EGG ELITE 4.0 (BURR) IMPLANT
BUR EGG ELITE 4.0MM (BURR)
BUR MATCHSTICK NEURO 3.0 LAGG (BURR) IMPLANT
CANISTER SUCTION 2500CC (MISCELLANEOUS) ×3 IMPLANT
CLOSURE STERI-STRIP 1/2X4 (GAUZE/BANDAGES/DRESSINGS) ×1
CLOSURE WOUND 1/2 X4 (GAUZE/BANDAGES/DRESSINGS) ×1
CLSR STERI-STRIP ANTIMIC 1/2X4 (GAUZE/BANDAGES/DRESSINGS) ×2 IMPLANT
CORDS BIPOLAR (ELECTRODE) ×3 IMPLANT
COVER MAYO STAND STRL (DRAPES) ×3 IMPLANT
COVER SURGICAL LIGHT HANDLE (MISCELLANEOUS) ×4 IMPLANT
CRADLE DONUT ADULT HEAD (MISCELLANEOUS) ×3 IMPLANT
CUFF TOURNIQUET SINGLE 34IN LL (TOURNIQUET CUFF) IMPLANT
CUFF TOURNIQUET SINGLE 44IN (TOURNIQUET CUFF) IMPLANT
DERMABOND ADVANCED (GAUZE/BANDAGES/DRESSINGS)
DERMABOND ADVANCED .7 DNX12 (GAUZE/BANDAGES/DRESSINGS) IMPLANT
DRAPE C-ARM 42X72 X-RAY (DRAPES) ×3 IMPLANT
DRAPE POUCH INSTRU U-SHP 10X18 (DRAPES) ×3 IMPLANT
DRAPE PROXIMA HALF (DRAPES) IMPLANT
DRAPE SURG 17X23 STRL (DRAPES) ×3 IMPLANT
DRAPE TABLE COVER HEAVY DUTY (DRAPES) IMPLANT
DRAPE U-SHAPE 47X51 STRL (DRAPES) ×3 IMPLANT
DRSG ADAPTIC 3X8 NADH LF (GAUZE/BANDAGES/DRESSINGS) IMPLANT
DRSG MEPILEX BORDER 4X4 (GAUZE/BANDAGES/DRESSINGS) ×3 IMPLANT
DRSG OPSITE 6X11 MED (GAUZE/BANDAGES/DRESSINGS) IMPLANT
DURAPREP 26ML APPLICATOR (WOUND CARE) ×3 IMPLANT
ELECT BLADE 4.0 EZ CLEAN MEGAD (MISCELLANEOUS)
ELECT BLADE 6.5 EXT (BLADE) ×3 IMPLANT
ELECT CAUTERY BLADE 6.4 (BLADE) ×3 IMPLANT
ELECT COATED BLADE 2.86 ST (ELECTRODE) ×3 IMPLANT
ELECT PENCIL ROCKER SW 15FT (MISCELLANEOUS) ×3 IMPLANT
ELECT REM PT RETURN 9FT ADLT (ELECTROSURGICAL) ×3
ELECTRODE BLDE 4.0 EZ CLN MEGD (MISCELLANEOUS) IMPLANT
ELECTRODE REM PT RTRN 9FT ADLT (ELECTROSURGICAL) ×1 IMPLANT
EVACUATOR 1/8 PVC DRAIN (DRAIN) IMPLANT
GAUZE SPONGE 4X4 12PLY STRL (GAUZE/BANDAGES/DRESSINGS) ×3 IMPLANT
GLOVE BIOGEL PI IND STRL 8 (GLOVE) ×1 IMPLANT
GLOVE BIOGEL PI IND STRL 8.5 (GLOVE) ×1 IMPLANT
GLOVE BIOGEL PI INDICATOR 8 (GLOVE) ×2
GLOVE BIOGEL PI INDICATOR 8.5 (GLOVE) ×2
GLOVE ORTHO TXT STRL SZ7.5 (GLOVE) ×3 IMPLANT
GLOVE SS BIOGEL STRL SZ 8 (GLOVE) ×1 IMPLANT
GLOVE SS BIOGEL STRL SZ 8.5 (GLOVE) ×1 IMPLANT
GLOVE SUPERSENSE BIOGEL SZ 8 (GLOVE) ×2
GLOVE SUPERSENSE BIOGEL SZ 8.5 (GLOVE) ×2
GOWN STRL REUS W/ TWL LRG LVL3 (GOWN DISPOSABLE) ×1 IMPLANT
GOWN STRL REUS W/ TWL XL LVL3 (GOWN DISPOSABLE) ×1 IMPLANT
GOWN STRL REUS W/TWL 2XL LVL3 (GOWN DISPOSABLE) ×6 IMPLANT
GOWN STRL REUS W/TWL LRG LVL3 (GOWN DISPOSABLE) ×3
GOWN STRL REUS W/TWL XL LVL3 (GOWN DISPOSABLE) ×3
IMPLANT MEDIUM TCS 7MM (Neuro Prosthesis/Implant) ×3 IMPLANT
KIT BASIN OR (CUSTOM PROCEDURE TRAY) ×3 IMPLANT
KIT POSITION SURG JACKSON T1 (MISCELLANEOUS) IMPLANT
KIT ROOM TURNOVER OR (KITS) ×3 IMPLANT
NEEDLE 22X1 1/2 (OR ONLY) (NEEDLE) ×3 IMPLANT
NEEDLE SPNL 18GX3.5 QUINCKE PK (NEEDLE) ×3 IMPLANT
NS IRRIG 1000ML POUR BTL (IV SOLUTION) ×3 IMPLANT
PACK LAMINECTOMY ORTHO (CUSTOM PROCEDURE TRAY) IMPLANT
PACK ORTHO CERVICAL (CUSTOM PROCEDURE TRAY) ×3 IMPLANT
PACK UNIVERSAL I (CUSTOM PROCEDURE TRAY) ×3 IMPLANT
PAD ARMBOARD 7.5X6 YLW CONV (MISCELLANEOUS) ×6 IMPLANT
PATTIES SURGICAL .25X.25 (GAUZE/BANDAGES/DRESSINGS) IMPLANT
PATTIES SURGICAL .5 X.5 (GAUZE/BANDAGES/DRESSINGS) IMPLANT
PATTIES SURGICAL .5 X1 (DISPOSABLE) ×3 IMPLANT
PIN DISTRACTION 14 (PIN) ×3 IMPLANT
PIN RETAINER PRODISC 14 MM (PIN) ×3 IMPLANT
PUTTY BONE DBX 2.5 MIS (Bone Implant) ×2 IMPLANT
RESTRAINT LIMB HOLDER UNIV (RESTRAINTS) ×3 IMPLANT
SCREW LOCKING 3.8X16MM (Screw) ×6 IMPLANT
SCREW TCS 16X3.5MM (Screw) ×6 IMPLANT
SPONGE INTESTINAL PEANUT (DISPOSABLE) ×3 IMPLANT
SPONGE LAP 4X18 X RAY DECT (DISPOSABLE) ×6 IMPLANT
SPONGE SURGIFOAM ABS GEL 100 (HEMOSTASIS) ×3 IMPLANT
STRIP CLOSURE SKIN 1/2X4 (GAUZE/BANDAGES/DRESSINGS) ×2 IMPLANT
SURGIFLO W/THROMBIN 8M KIT (HEMOSTASIS) ×2 IMPLANT
SUT BONE WAX W31G (SUTURE) ×3 IMPLANT
SUT ETHILON 3 0 FSL (SUTURE) IMPLANT
SUT MON AB 3-0 SH 27 (SUTURE) ×3
SUT MON AB 3-0 SH27 (SUTURE) ×1 IMPLANT
SUT PDS AB 1 CTX 36 (SUTURE) ×1 IMPLANT
SUT SILK 2 0 (SUTURE)
SUT SILK 2-0 18XBRD TIE 12 (SUTURE) IMPLANT
SUT VIC AB 0 CTB1 27 (SUTURE) IMPLANT
SUT VIC AB 1 CT1 18XCR BRD 8 (SUTURE) ×1 IMPLANT
SUT VIC AB 1 CT1 8-18 (SUTURE) ×3
SUT VIC AB 1 CTX 36 (SUTURE)
SUT VIC AB 1 CTX36XBRD ANBCTR (SUTURE) IMPLANT
SUT VIC AB 2-0 CT1 18 (SUTURE) ×3 IMPLANT
SUT VIC AB 3-0 X1 27 (SUTURE) IMPLANT
SYR BULB IRRIGATION 50ML (SYRINGE) ×3 IMPLANT
SYR CONTROL 10ML LL (SYRINGE) ×3 IMPLANT
TAPE CLOTH 4X10 WHT NS (GAUZE/BANDAGES/DRESSINGS) ×3 IMPLANT
TAPE UMBILICAL COTTON 1/8X30 (MISCELLANEOUS) ×3 IMPLANT
TOWEL OR 17X24 6PK STRL BLUE (TOWEL DISPOSABLE) ×3 IMPLANT
TOWEL OR 17X26 10 PK STRL BLUE (TOWEL DISPOSABLE) ×3 IMPLANT
TRAY FOLEY CATH 16FRSI W/METER (SET/KITS/TRAYS/PACK) ×3 IMPLANT
WATER STERILE IRR 1000ML POUR (IV SOLUTION) IMPLANT
YANKAUER SUCT BULB TIP NO VENT (SUCTIONS) ×3 IMPLANT

## 2015-05-24 NOTE — Anesthesia Postprocedure Evaluation (Signed)
  Anesthesia Post-op Note  Patient: [name] [name]  Procedure(s) Performed: Procedure(s): REMOVAL OF CERVICAL HARDWARE C5-6 ACDF C6-7 (N/A) HARDWARE REMOVAL (N/A)  Patient Location: PACU  Anesthesia Type:General  Level of Consciousness: awake  Airway and Oxygen Therapy: Patient Spontanous Breathing  Post-op Pain: mild  Post-op Assessment: Post-op Vital signs reviewed, Patient's Cardiovascular Status Stable, Respiratory Function Stable, Patent Airway, No signs of Nausea or vomiting and Pain level controlled    LLE Sensation: Full sensation, No numbness, No pain, No tingling   RLE Sensation: Full sensation, No numbness, No pain, No tingling      Post-op Vital Signs: Reviewed and stable  Last Vitals:  Filed Vitals:   05/24/15 1800  BP: 97/60  Pulse: 54  Temp:   Resp: 19    Complications: No apparent anesthesia complications

## 2015-05-24 NOTE — Transfer of Care (Signed)
Immediate Anesthesia Transfer of Care Note  Patient: [name] [name]  Procedure(s) Performed: Procedure(s): REMOVAL OF CERVICAL HARDWARE C5-6 ACDF C6-7 (N/A) HARDWARE REMOVAL (N/A)  Patient Location: PACU  Anesthesia Type:General  Level of Consciousness: awake, oriented, sedated, patient cooperative and responds to stimulation  Airway & Oxygen Therapy: Patient Spontanous Breathing and Patient connected to nasal cannula oxygen  Post-op Assessment: Report given to RN, Post -op Vital signs reviewed and stable, Patient moving all extremities and Patient moving all extremities X 4  Post vital signs: Reviewed and stable  Last Vitals:  Filed Vitals:   05/24/15 0952  BP: 135/72  Pulse: 77  Temp: 36.4 C  Resp: 18    Complications: No apparent anesthesia complications

## 2015-05-24 NOTE — H&P (Signed)
History of Present Illness The patient is a 49 year old female who comes in today for a preoperative History and Physical. The patient is scheduled for a REMOVAL HARDWARE C5-6, ACDF C6-7 to be performed by Dr. Debria Garretahari D. Shon BatonBrooks, MD at [facility] on 05/24/15 . Please see the hospital record for complete dictated history and physical.  Additional reasons for visit:  Follow-up Neck is described as the following: The patient is being followed for their left-sided. Symptoms reported today include: pain (left lateral neck), pain at night, aching, throbbing, stiffness, locking, catching, pain with overhead motions, weakness (in left arm) and numbness (left hand, fingers small and ring. She has developed numbness in the right hand , fingers index, long and ring). The patient feels that they are doing poorly and report their pain level to be 7 / 10. The following medication has been used for pain control: antiinflammatory medication (Aleve) and Oxycodone (rX'D BY pcp). The patient presents today following MRI (C-spine @ [facility] 04-26-15). Note for "Follow-up Neck": The patient had a cervical fusion 2001 by neurosurgeon. The pt is a Garment/textile technologistscreen printer.  Allergies  Codeine Derivatives Itching. Iodinated Contrast Itching.  Family History Cancer Maternal Grandmother, Mother, Paternal Grandfather. Congestive Heart Failure Maternal Grandfather. First Degree Relatives reported Heart Disease Maternal Grandfather. Heart disease in female family member before age 51 Hypertension Father, Maternal Grandfather, Mother.  Social History  Tobacco use Never smoker. 03/27/2014 Tobacco / smoke exposure 03/27/2014: no Children 2 Current work status working full time Exercise Exercises never Living situation live with spouse Marital status married Not under pain contract Number of flights of stairs before winded 4-5 No history of drug/alcohol rehab  Medication History OxyCODONE HCl (5MG  Tablet,  Oral) Active. (prn Rx'd by PCP) Aleve (220MG  Tablet, Oral) Active. Medications Reconciled  Vitals  05/21/2015 9:09 AM Weight: 216 lb Height: 66.5in Body Surface Area: 2.08 m Body Mass Index: 34.34 kg/m  Temp.: 98.74F  Pulse: 92 (Regular)  BP: 174/86 (Sitting, Left Arm, Standard)  General General Appearance-Not in acute distress. Orientation-Oriented X3. Build & Nutrition-Well nourished and Well developed.  Integumentary General Characteristics Surgical Scars - surgical scarring consistent with previous lumbar surgery and anterior neck surgical scarring consistent with previous cervical surgery. Cervical Spine-Skin examination of the cervical spine is without deformity, skin lesions, lacerations or abrasions.  Chest and Lung Exam Auscultation Breath sounds - Normal and Clear.  Cardiovascular Auscultation Rhythm - Regular rate and rhythm.  Peripheral Vascular Upper Extremity Palpation - Radial pulse - Bilateral - 2+.  Neurologic Sensation Upper Extremity - Bilateral - sensation is intact in the upper extremity except for left C7 distribution.  Decreased sensation to LT and pain in this dermatome. Reflexes Biceps Reflex - Bilateral - 2+. Brachioradialis Reflex - Bilateral - 2+. Triceps Reflex - Bilateral - 2+. Hoffman's Sign - Bilateral - Hoffman's sign not present.  Musculoskeletal Spine/Ribs/Pelvis  Cervical Spine : Inspection and Palpation - Tenderness - right cervical paraspinals tender to palpation, left cervical paraspinals tender to palpation, right trapezius tender to palpation and left trapezius tender to palpation. Strength and Tone: Strength - Deltoid - Bilateral - 5/5. Biceps - Bilateral - 5/5. Left Triceps - 4/5. Wrist Extension - Bilateral - 5/5. Hand Grip - Bilateral - 5/5. Heel walk - Bilateral - able to heel walk without difficulty. Toe Walk - Bilateral - able to walk on toes without difficulty. Heel-Toe Walk - Bilateral - able to  heel-toe walk without difficulty. ROM - Flexion - Mildly decreased and painful.  Extension - Mildly decreased and painful. Left Lateral Flexion - Mildly decreased and painful. Right Lateral Flexion - Mildly decreased and painful. Left Rotation - Mildly decreased and painful. Right Rotation - Mildly decreased and painful. Pain - neither flexion or extension is more painful than the other. Cervical Spine - Special Testing - axial compression test negative, cross chest impingement test negative. Non-Anatomic Signs - No non-anatomic signs present. Upper Extremity Range of Motion - No truesholder pain with IR/ER of the shoulders.   Plans Transcription At this point in time, she would like to proceed with revision ACDF. This would entail removing the CSLP anterior plate and making sure she had solid fusion at the 5-6 level and then doing a instrumented ACDF at C6-7. Because this is a revision, I will use an external bone stimulator following the surgery for at least nine months to encourage the fusion mass development. We will use the Aspen collar for two weeks and then transition her to a soft collar. We have again reviewed all the risks, benefits and alternatives of surgery. All of her questions were addressed. She has an appointment with her primary care physician later on this week. She will have her preop clearance done at that time. She has an appointment with the ENT specialist tomorrow to make sure that the vocal cords are normal and I could approach this from the right side.

## 2015-05-24 NOTE — Anesthesia Procedure Notes (Signed)
Procedure Name: Intubation Date/Time: 05/24/2015 12:57 PM Performed by: Wray KearnsFOLEY, Madilyne Tadlock A Pre-anesthesia Checklist: Patient identified, Timeout performed, Emergency Drugs available, Suction available and Patient being monitored Patient Re-evaluated:Patient Re-evaluated prior to inductionOxygen Delivery Method: Circle system utilized Preoxygenation: Pre-oxygenation with 100% oxygen Intubation Type: IV induction and Cricoid Pressure applied Ventilation: Mask ventilation without difficulty and Oral airway inserted - appropriate to patient size Laryngoscope Size: Miller and 2 Grade View: Grade I Tube type: Oral Tube size: 7.5 mm Number of attempts: 1 Airway Equipment and Method: Stylet Placement Confirmation: ETT inserted through vocal cords under direct vision,  breath sounds checked- equal and bilateral and positive ETCO2 Secured at: 22 cm Tube secured with: Tape Dental Injury: Teeth and Oropharynx as per pre-operative assessment

## 2015-05-24 NOTE — Brief Op Note (Signed)
05/24/2015  4:06 PM  PATIENT:  Elyn Aquasandace D Pruden  49 y.o. 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  PROCEDURE:  Procedure(s): REMOVAL OF CERVICAL HARDWARE C5-6 ACDF C6-7 (N/A) HARDWARE REMOVAL (N/A)  SURGEON:  Surgeon(s) and Role:    * Venita Lickahari Jerauld Bostwick, MD - Primary  PHYSICIAN ASSISTANT:   ASSISTANTS: Carmen Mayo   ANESTHESIA:   general  EBL:  Total I/O In: 2000 [I.V.:2000] Out: 600 [Urine:500; Blood:100]  BLOOD ADMINISTERED:none  DRAINS: none   LOCAL MEDICATIONS USED:  MARCAINE     SPECIMEN:  No Specimen  DISPOSITION OF SPECIMEN:  N/A  COUNTS:  YES  TOURNIQUET:  * No tourniquets in log *  DICTATION: .Other Dictation: Dictation Number [id number]  PLAN OF CARE: Admit for overnight observation  PATIENT DISPOSITION:  PACU - hemodynamically stable.

## 2015-05-24 NOTE — Anesthesia Preprocedure Evaluation (Addendum)
Anesthesia Evaluation  Patient identified by MRN, date of birth, ID band Patient awake    Reviewed: Allergy & Precautions, NPO status , Patient's Chart, lab work & pertinent test results  History of Anesthesia Complications Negative for: history of anesthetic complications  Airway Mallampati: II  TM Distance: >3 FB Neck ROM: Full    Dental  (+) Dental Advisory Given   Pulmonary neg pulmonary ROS,    breath sounds clear to auscultation       Cardiovascular  Rhythm:Regular Rate:Normal     Neuro/Psych Chronic pain: narcotics    GI/Hepatic Neg liver ROS, GERD  Controlled,  Endo/Other  Morbid obesity  Renal/GU negative Renal ROS     Musculoskeletal   Abdominal (+) + obese,   Peds  Hematology negative hematology ROS (+)   Anesthesia Other Findings   Reproductive/Obstetrics S/p oopherectomy                          Anesthesia Physical Anesthesia Plan  ASA: II  Anesthesia Plan: General   Post-op Pain Management:    Induction: Intravenous  Airway Management Planned: Oral ETT  Additional Equipment:   Intra-op Plan:   Post-operative Plan: Extubation in OR  Informed Consent: I have reviewed the patients History and Physical, chart, labs and discussed the procedure including the risks, benefits and alternatives for the proposed anesthesia with the patient or authorized representative who has indicated his/her understanding and acceptance.   Dental advisory given  Plan Discussed with: CRNA and Surgeon  Anesthesia Plan Comments: (Plan routine monitors, GETA)        Anesthesia Quick Evaluation

## 2015-05-25 ENCOUNTER — Encounter (HOSPITAL_COMMUNITY): Payer: Self-pay | Admitting: General Practice

## 2015-05-25 DIAGNOSIS — M50123 Cervical disc disorder at C6-C7 level with radiculopathy: Secondary | ICD-10-CM | POA: Diagnosis not present

## 2015-05-25 MED ORDER — ONDANSETRON HCL 4 MG PO TABS: 4.0000 mg | ORAL_TABLET | Freq: Three times a day (TID) | ORAL | Status: DC | PRN

## 2015-05-25 MED ORDER — DIPHENHYDRAMINE HCL 12.5 MG/5ML PO ELIX
12.5000 mg | ORAL_SOLUTION | Freq: Four times a day (QID) | ORAL | Status: DC | PRN
  Administered 2015-05-25 (×2): 25 mg via ORAL
  Filled 2015-05-25: qty 10

## 2015-05-25 MED ORDER — METHOCARBAMOL 500 MG PO TABS: 500.0000 mg | ORAL_TABLET | Freq: Three times a day (TID) | ORAL | Status: DC | PRN

## 2015-05-25 MED ORDER — OXYCODONE-ACETAMINOPHEN 10-325 MG PO TABS: 1.0000 | ORAL_TABLET | ORAL | Status: DC | PRN

## 2015-05-25 NOTE — Progress Notes (Signed)
D/c teaching completed to patient and husband including review AVS, instructions and prescriptions. Gave Rx to husband and AVS.

## 2015-05-25 NOTE — Progress Notes (Signed)
Occupational Therapy Evaluation Patient Details Name: [name] [name] MRN: [id number] DOB: 08-Aug-1965 Today's Date: 05/25/2015    History of Present Illness s/p REMOVAL HARDWARE C5-6, ACDF C6-7 on 05/24/15   Clinical Impression   Patient presents to OT with decreased ADL independence post-op. All education completed and patient to d/c home with assistance from family. OT will sign off.    Follow Up Recommendations  No OT follow up;Supervision/Assistance - 24 hour    Equipment Recommendations  None recommended by OT    Recommendations for Other Services PT consult     Precautions / Restrictions Precautions Precautions: Cervical Required Braces or Orthoses: Cervical Brace Cervical Brace: Hard collar;At all times Restrictions Weight Bearing Restrictions: No      Mobility Bed Mobility Overal bed mobility: Needs Assistance Bed Mobility: Sidelying to Sit;Sit to Sidelying   Sidelying to sit: Supervision;HOB elevated     Sit to sidelying: Supervision;HOB elevated    Transfers Overall transfer level: Needs assistance Equipment used: None Transfers: Sit to/from Stand Sit to Stand: Supervision              Balance                                            ADL Overall ADL's : Needs assistance/impaired Eating/Feeding: Independent;Sitting   Grooming: Wash/dry hands;Wash/dry face;Supervision/safety;Standing   Upper Body Bathing: Set up;Sitting   Lower Body Bathing: Minimal assistance;Sit to/from stand   Upper Body Dressing : Set up;Sitting   Lower Body Dressing: Moderate assistance;Sit to/from stand   Toilet Transfer: Supervision/safety;Ambulation;Comfort height toilet   Toileting- Clothing Manipulation and Hygiene: Supervision/safety;Sit to/from stand       Functional mobility during ADLs: Supervision/safety General ADL Comments: Patient educated on cervical precautions. Verbalized understanding. Patient performed bed mobility, amb  to/from bathroom to toilet and stand to groom at sink, back to bed. Patient educated on use of reacher at home. She says she might have one in her attic or she may purchase one. Patient reports husband/sister to assist with LB ADLs for now. Patient reports surgeon told her not to shower x 5 days. Patient to sponge bathe for now. Educated her to have family with her first few times she shwoers. She verbalized understanding. All education completed.     Vision     Perception     Praxis      Pertinent Vitals/Pain Pain Assessment: 0-10 Pain Score: 4  Pain Location: incision Pain Descriptors / Indicators: Aching;Operative site guarding;Sore Pain Intervention(s): Limited activity within patient's tolerance;Monitored during session;Repositioned     Hand Dominance Left   Extremity/Trunk Assessment Upper Extremity Assessment Upper Extremity Assessment: Generalized weakness;RUE deficits/detail;LUE deficits/detail RUE Deficits / Details: pt reports numbness at fingertips LUE Deficits / Details: pt reports numbness at fingertips   Lower Extremity Assessment Lower Extremity Assessment: Overall WFL for tasks assessed   Cervical / Trunk Assessment Cervical / Trunk Assessment: Other exceptions Cervical / Trunk Exceptions: hard cervical collar   Communication Communication Communication: No difficulties   Cognition Arousal/Alertness: Awake/alert Behavior During Therapy: WFL for tasks assessed/performed Overall Cognitive Status: Within Functional Limits for tasks assessed                     General Comments       Exercises       Shoulder Instructions      Home Living Family/patient expects to  be discharged to:: Private residence Living Arrangements: Spouse/significant other Available Help at Discharge: Available 24 hours/day;Family (sister to A when husband goes back to work on Monday) Type of Home: House Home Access: Stairs to enter Entergy CorporationEntrance Stairs-Number of Steps:  2-3   Home Layout: One level     Bathroom Shower/Tub: Chief Strategy OfficerTub/shower unit   Bathroom Toilet: Handicapped height Bathroom Accessibility: Yes How Accessible: Accessible via walker;Accessible via wheelchair Home Equipment: Dan HumphreysWalker - 2 wheels;Cane - single point;Bedside commode          Prior Functioning/Environment Level of Independence: Needs assistance    ADL's / Homemaking Assistance Needed: husband assisted with LB dressing PTA        OT Diagnosis: Acute pain;Generalized weakness   OT Problem List: Decreased strength;Decreased knowledge of precautions;Pain;Impaired sensation   OT Treatment/Interventions:      OT Goals(Current goals can be found in the care plan section) Acute Rehab OT Goals Patient Stated Goal: to go home OT Goal Formulation: All assessment and education complete, DC therapy  OT Frequency:     Barriers to D/C:            Co-evaluation              End of Session Equipment Utilized During Treatment: Cervical collar Nurse Communication: Mobility status  Activity Tolerance: Patient tolerated treatment well Patient left: in bed;with call bell/phone within reach;with SCD's reapplied   Time: 0832-0903 OT Time Calculation (min): 31 min Charges:  OT General Charges $OT Visit: 1 Procedure OT Evaluation $Initial OT Evaluation Tier I: 1 Procedure OT Treatments $Self Care/Home Management : 8-22 mins G-Codes: OT G-codes **NOT FOR INPATIENT CLASS** Functional Assessment Tool Used: cklinical judgment Functional Limitation: Self care Self Care Current Status (U9811(G8987): At least 20 percent but less than 40 percent impaired, limited or restricted Self Care Goal Status ([id number](G8988): At least 1 percent but less than 20 percent impaired, limited or restricted Self Care Discharge Status [phone]([id number]): At least 20 percent but less than 40 percent impaired, limited or restricted  Jamire Shabazz A 05/25/2015, 9:14 AM

## 2015-05-25 NOTE — Evaluation (Signed)
Physical Therapy Evaluation Patient Details Name: [name] [name] MRN: [id number] DOB: 20-Apr-1966 Today's Date: 05/25/2015   History of Present Illness  49 y.o. female s/p REMOVAL HARDWARE C5-6, ACDF C6-7 on 05/24/15  Clinical Impression  Patient is s/p above surgery resulting in functional limitations due to the deficits listed below (see PT Problem List).  Patient will benefit from skilled PT to increase their independence and safety with mobility to allow discharge to the venue listed below. Patient may benefit from PT to advance mobility to independent level. Will follow as appropriate.      Follow Up Recommendations No PT follow up    Equipment Recommendations  None recommended by PT    Recommendations for Other Services       Precautions / Restrictions Precautions Precautions: Cervical Required Braces or Orthoses: Cervical Brace Cervical Brace: Hard collar;At all times Restrictions Weight Bearing Restrictions: No      Mobility  Bed Mobility               General bed mobility comments: up in chair upon arrival. Did verbal review of logroll, patient confirms understanding and no need to perform  Transfers Overall transfer level: Needs assistance Equipment used: None Transfers: Sit to/from Stand Sit to Stand: Supervision            Ambulation/Gait Ambulation/Gait assistance: Supervision Ambulation Distance (Feet): 350 Feet Assistive device: None Gait Pattern/deviations: Step-through pattern     General Gait Details: even strides, no instability noted.   Stairs Stairs: Yes Stairs assistance: Supervision Stair Management: One rail Left;Alternating pattern;Forwards Number of Stairs: 5 General stair comments: safe technique.   Wheelchair Mobility    Modified Rankin (Stroke Patients Only)       Balance Overall balance assessment: Needs assistance Sitting-balance support: No upper extremity supported Sitting balance-Leahy Scale: Good      Standing balance support: No upper extremity supported Standing balance-Leahy Scale: Good                               Pertinent Vitals/Pain Pain Assessment: 0-10 Pain Score: 4  Pain Location: neck/chest Pain Descriptors / Indicators: Sore Pain Intervention(s): Limited activity within patient's tolerance;Monitored during session    Home Living Family/patient expects to be discharged to:: Private residence Living Arrangements: Spouse/significant other Available Help at Discharge: Family;Available 24 hours/day Type of Home: House Home Access: Stairs to enter Entrance Stairs-Rails: Left Entrance Stairs-Number of Steps: 2 Home Layout: One level Home Equipment: Walker - 2 wheels;Cane - single point      Prior Function Level of Independence: Independent               Hand Dominance        Extremity/Trunk Assessment   Upper Extremity Assessment: Defer to OT evaluation           Lower Extremity Assessment: Overall WFL for tasks assessed         Communication   Communication: No difficulties  Cognition Arousal/Alertness: Awake/alert Behavior During Therapy: WFL for tasks assessed/performed Overall Cognitive Status: Within Functional Limits for tasks assessed                      General Comments      Exercises        Assessment/Plan    PT Assessment Patient needs continued PT services  PT Diagnosis Other (comment);Difficulty walking   PT Problem List Decreased activity tolerance;Decreased mobility  PT Treatment  Interventions Gait training;Stair training;Functional mobility training   PT Goals (Current goals can be found in the Care Plan section) Acute Rehab PT Goals Patient Stated Goal: to go home PT Goal Formulation: With patient Time For Goal Achievement: 06/08/15 Potential to Achieve Goals: Good    Frequency Min 2X/week   Barriers to discharge        Co-evaluation               End of Session Equipment  Utilized During Treatment: Gait belt Activity Tolerance: Patient tolerated treatment well Patient left: in chair;with call bell/phone within reach;with family/visitor present Nurse Communication: Mobility status         Time: 1610-9604 PT Time Calculation (min) (ACUTE ONLY): 17 min   Charges:   PT Evaluation $Initial PT Evaluation Tier I: 1 Procedure     PT G Codes:        Christiane Ha, PT, CSCS Pager [phone] Office [phone]  05/25/2015, 2:47 PM

## 2015-05-25 NOTE — Progress Notes (Signed)
    Subjective: Procedure(s) (LRB): REMOVAL OF CERVICAL HARDWARE C5-6 ACDF C6-7 (N/A) HARDWARE REMOVAL (N/A) 1 Day Post-Op  Patient reports pain as 2 on 0-10 scale.  Reports decreased arm pain reports incisional neck pain   N/A void - foley remain inplace Negative bowel movement Positive flatus Negative chest pain or shortness of breath  Objective: Vital signs in last 24 hours: Temp:  [97.5 F (36.4 C)-98.4 F (36.9 C)] 97.9 F (36.6 C) (10/28 0630) Pulse Rate:  [54-89] 67 (10/28 0630) Resp:  [15-23] 18 (10/28 0630) BP: (97-135)/(53-72) 110/68 mmHg (10/28 0630) SpO2:  [97 %-100 %] 98 % (10/28 0630) Weight:  [90.719 kg (200 lb)] 90.719 kg (200 lb) (10/27 0952)  Intake/Output from previous day: 10/27 0701 - 10/28 0700 In: 2100 [I.V.:2000; IV Piggyback:100] Out: 875 [Urine:775; Blood:100]  Labs:  Recent Labs  05/24/15 1057  WBC 7.1  RBC 4.42  HCT 38.8  PLT 259    Recent Labs  05/24/15 1057  NA 143  K 3.7  CL 107  CO2 27  BUN 9  CREATININE 0.74  GLUCOSE 89  CALCIUM 9.8   No results for input(s): LABPT, INR in the last 72 hours.  Physical Exam: Neurologically intact ABD soft Intact pulses distally Incision: dressing C/D/I Compartment soft  Assessment/Plan: Patient stable  xrays satisfactory Mobilization with physical therapy Encourage incentive spirometry Continue care  Advance diet Up with therapy  Plan on d/c after voiding and ambulating with therapy/nsg  [name] Lickahari Deshanti Adcox, MD [facility] [phone]

## 2015-05-25 NOTE — Discharge Instructions (Signed)

## 2015-05-25 NOTE — Op Note (Signed)
[name] [name], [name] [name]                ACCOUNT NO.:  [id number]  MEDICAL RECORD NO.:  [id number]  LOCATION:  [id number]                        FACILITY:  [facility]  PHYSICIAN:  Arsal Tappan D. Shon Baton, M.D. 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  OPERATIVE PROCEDURE: 1. Anterior cervical diskectomy and fusion, C6-7 using a titanium zero-     profile plate with 16 mm locking screws. 2. Removal of anterior cervical hardware, C5-C6 with exploration of     fusion from C5-C6. 3. Use of fluoro with interpretation.  COMPLICATIONS:  None.  CONDITION:  Stable.  FIRST ASSISTANT:  [facility].  CLINICAL EXAM:  She is a pleasant 49 year old woman who in 2001 had an ACDF.  She has been having progressive left arm pain in the C7 distribution.  As a result, we elected to proceed with surgery after reviewing the MRI and having her failed conservative management.  All appropriate risks, benefits, and alternatives to surgery were discussed. Consent was obtained.  OPERATIVE NOTE:  The patient was brought to the operating room, placed supine on the operating table.  After successful induction of general anesthesia and endotracheal intubation, TEDs, SCDs were applied as was a Foley.  The anterior cervical spine was prepped and draped in a standard fashion.  Time-out was taken to confirm patient, procedure, and all other pertinent important data.  Once this was completed, fluoro view was taken to identify the C6 vertebral body.  Once this was identified, I started with a right standard Smith-Robinson approach through a transverse incision to the cervical spine.  The patient had previous left-sided approach and preoperative EMG evaluation, which demonstrated no focal  vocal cord abnormalities, so we elected to go through the right side.  A transverse incision was made.  Sharp dissection was carried out down to the platysma.  Platysma was sharply incised.  I identified the omohyoid muscle and sacrificed it.  I continued my dissection in a standard fashion in the deep cervical and prevertebral fascia.  I then swept the esophagus and trachea to the left and protected it.  I identified the carotid sheath and protected it with a finger.  Using [employer], I exposed the C5-6 plate in its entirety.  I then placed a needle into the 6-7 disk space, took an x-ray just to confirm that I was at the appropriate level.  Once this was done, I removed the locking cap and all 4 screws.  I then removed the plate without incident.  Exploration of the 5-6 fusion showed a solid arthrodesis with no evidence of pseudoarthrosis.  Self-retaining retractors were placed underneath the longus colli muscle and the endotracheal cuff was deflated and expanded the retractors and then reinflated the endotracheal cuff.  I had good visualization of the C6-7 disk space.  An annulotomy was performed with a 15-blade scalpel and then using pituitary rongeurs, curettes, and Kerrison rongeurs, I removed the majority of the disk material.  Distraction pins were placed into the  body of C7 and then into C6 through one of the previous screw holes.  I distracted the intervertebral space and then maintained the distraction with the distraction pin set.  I continued to use curettes to remove the posterior disk and annulus.  Once this was removed, I then used 1 mm Kerrison to remove the posterior osteophytes from the inferior aspect of C6 and C7 including the uncovertebral joint osteophytes.  I removed the hard disk osteophyte from the midline.  At this point, I was pleased with the overall decompression I rasped the endplates to ensure I had bleeding subchondral bone.  I trialed and then  placed a size 7 medium lordotic cage into the space.  Two rescue screws, 16 mm in length, one superior, one inferior were placed, and had excellent purchase.  I then irrigated the wound copiously with normal saline, removed the distraction pin set, and then a self-retaining retractor.  I made sure I had hemostasis using bipolar electrocautery and Floseal.  I then returned the trachea and esophagus to midline, closed the platysma with interrupted 2-0 Vicryl sutures and a 3-0 Monocryl for the skin. Steri-Strips and a dry dressing were applied.  The patient was ultimately extubated, transferred to the PACU without incident.     Cebastian Neis D. Shon BatonBrooks, M.D.     DDB/MEDQ  D:  05/24/2015  T:  05/25/2015  Job:  [id number]

## 2015-05-25 NOTE — Progress Notes (Signed)
Gave Oxy IR 10 mg PO for 1 hour ride home. [place] [employer] [place] and her personal belongings.

## 2015-05-28 NOTE — Discharge Summary (Signed)
Physician Discharge Summary  Patient ID: [name] [name] MRN: [id number] DOB/AGE: August 05, 1965 49 y.o.  Admit date: 05/24/2015 Discharge date: 05/28/2015  Admission Diagnoses:  Cervical pain and radiculopathy  Discharge Diagnoses:  Active Problems:   Neck pain   Past Medical History  Diagnosis Date  . Pelvic pain in female   . History of abnormal cervical Pap smear     hx chronic --  s/p vag. hysterectomy 1996  . Wears contact lenses   . Complication of anesthesia     difficulty breathing with Epidural during c -seaction  . Chronic neck pain     and arm pain  . Anemia   . DDD (degenerative disc disease), cervical     Surgeries: Procedure(s): REMOVAL OF CERVICAL HARDWARE C5-6 ACDF C6-7 HARDWARE REMOVAL on 05/24/2015   Consultants (if any):    Discharged Condition: Improved  Hospital Course: [name] [name] is an 49 y.o. female who was admitted 05/24/2015 with a diagnosis of cervical pain and radiculopathy and went to the operating room on 05/24/2015 and underwent the above named procedures.  The pts hospital course was uneventful. Pt was discharged on 05/25/15.  She was given perioperative antibiotics:  Anti-infectives    Start     Dose/Rate Route Frequency Ordered Stop   05/24/15 2200  ceFAZolin (ANCEF) IVPB 1 g/50 mL premix     1 g 100 mL/hr over 30 Minutes Intravenous Every 8 hours 05/24/15 2024 05/25/15 0546   05/24/15 1130  ceFAZolin (ANCEF) IVPB 2 g/50 mL premix     2 g 100 mL/hr over 30 Minutes Intravenous To ShortStay [facility] 05/23/15 1238 05/24/15 1315    .  She was given sequential compression devices, early ambulation, and TED for DVT prophylaxis.  She benefited maximally from the hospital stay and there were no complications.    Recent vital signs:  Filed Vitals:   05/25/15 1430  BP:   Pulse:   Temp: 98.1 F (36.7 C)  Resp:     Recent laboratory studies:  Lab Results  Component Value Date   HGB 12.5 05/24/2015   HGB 11.7* 03/09/2015   HGB  12.4 01/14/2014   Lab Results  Component Value Date   WBC 7.1 05/24/2015   PLT 259 05/24/2015   No results found for: INR Lab Results  Component Value Date   NA 143 05/24/2015   K 3.7 05/24/2015   CL 107 05/24/2015   CO2 27 05/24/2015   BUN 9 05/24/2015   CREATININE 0.74 05/24/2015   GLUCOSE 89 05/24/2015    Discharge Medications:     Medication List    STOP taking these medications        FLEXERIL 10 MG tablet  Generic drug:  cyclobenzaprine     HYDROmorphone 2 MG tablet  Commonly known as:  DILAUDID     oxyCODONE-acetaminophen 7.5-325 MG tablet  Commonly known as:  PERCOCET  Replaced by:  oxyCODONE-acetaminophen 10-325 MG tablet      TAKE these medications        methocarbamol 500 MG tablet  Commonly known as:  ROBAXIN  Take 1 tablet (500 mg total) by mouth 3 (three) times daily as needed for muscle spasms.     ondansetron 4 MG tablet  Commonly known as:  ZOFRAN  Take 1 tablet (4 mg total) by mouth every 8 (eight) hours as needed for nausea or vomiting.     oxyCODONE-acetaminophen 10-325 MG tablet  Commonly known as:  PERCOCET  Take 1 tablet by mouth  every 4 (four) hours as needed for pain.        Diagnostic Studies: Dg Cervical Spine 2 Or 3 Views  05/24/2015  CLINICAL DATA:  Status post removal of cervical spine fixation hardware today. EXAM: CERVICAL SPINE - 2-3 VIEW COMPARISON:  12/22/2013. FINDINGS: The anterior plate and screws at the C5-6 level have been removed. There is solid interbody bone fusion at that level. There is interval interbody hardware and screw fixation at the C6-7 level with normal alignment posteriorly. Mild anterior spur formation at the C4-5 level. Prevertebral soft tissue swelling at the level of the surgery and extending above and below that level. IMPRESSION: Postoperative changes, as described above. Electronically Signed   By: Beckie SaltsSteven  Reid M.D.   On: 05/24/2015 21:46   Dg Cervical Spine 2-3 Views  05/24/2015  CLINICAL DATA:   Surgery: REMOVAL OF CERVICAL HARDWARE C5-6; ACDF C6-7 Fluoro Time: 19 Sec EXAM: CERVICAL SPINE - 2-3 VIEW; DG C-ARM GT 120 MIN COMPARISON:  12/22/2013 FINDINGS: Anterior and lateral images are performed, showing endotracheal tube in place. Patient has undergone removal of hardware at C5-6. Interbody fusion device is identified at C6-7. IMPRESSION: ACDF C6-7. Electronically Signed   By: Norva PavlovElizabeth  Brown M.D.   On: 05/24/2015 15:48   Dg C-arm Gt 120 Min  05/24/2015  CLINICAL DATA:  Surgery: REMOVAL OF CERVICAL HARDWARE C5-6; ACDF C6-7 Fluoro Time: 19 Sec EXAM: CERVICAL SPINE - 2-3 VIEW; DG C-ARM GT 120 MIN COMPARISON:  12/22/2013 FINDINGS: Anterior and lateral images are performed, showing endotracheal tube in place. Patient has undergone removal of hardware at C5-6. Interbody fusion device is identified at C6-7. IMPRESSION: ACDF C6-7. Electronically Signed   By: Norva PavlovElizabeth  Brown M.D.   On: 05/24/2015 15:48    Disposition: 01-Home or Self Care        Follow-up Information    Follow up with Alvy BealBROOKS,DAHARI D, MD. Schedule an appointment as soon as possible for a visit in 2 weeks.   Specialty:  Orthopedic Surgery   Why:  If symptoms worsen, For suture removal, For wound re-check   Contact information:   [address] [place] [address] [phone]        Signed: Kirt BoysMayo, Carmen Christina 05/28/2015, 12:17 PM

## 2015-05-29 NOTE — Progress Notes (Signed)
PT addition to note g-code late entry.   05/25/15 1400  PT Time Calculation  PT Start Time (ACUTE ONLY) 1108  PT Stop Time (ACUTE ONLY) 1125  PT Time Calculation (min) (ACUTE ONLY) 17 min  PT G-Codes **NOT FOR INPATIENT CLASS**  Functional Assessment Tool Used clinical judgment  Functional Limitation Mobility: Walking and moving around  Mobility: Walking and Moving Around Current Status (W0981(G8978) CJ  Mobility: Walking and Moving Around Goal Status (X9147(G8979) CI  PT General Charges  $$ ACUTE PT VISIT 1 Procedure  PT Evaluation  $Initial PT Evaluation Tier I 1 Procedure  Christiane HaBenjamin J. Masha Orbach, PT, CSCS Pager [phone] Office [phone]

## 2015-05-31 ENCOUNTER — Encounter (HOSPITAL_COMMUNITY): Payer: Self-pay | Admitting: Orthopedic Surgery

## 2015-09-26 ENCOUNTER — Encounter: Payer: Self-pay | Admitting: Internal Medicine

## 2015-10-29 ENCOUNTER — Ambulatory Visit (AMBULATORY_SURGERY_CENTER): Payer: Self-pay

## 2015-10-29 DIAGNOSIS — Z860100 Personal history of colon polyps, unspecified: Secondary | ICD-10-CM

## 2015-10-29 MED ORDER — SUPREP BOWEL PREP KIT 17.5-3.13-1.6 GM/177ML PO SOLN: 1.0000 | Freq: Once | ORAL | Status: DC

## 2015-11-13 ENCOUNTER — Ambulatory Visit (AMBULATORY_SURGERY_CENTER): Payer: BLUE CROSS/BLUE SHIELD | Admitting: Internal Medicine

## 2015-11-13 ENCOUNTER — Encounter: Payer: Self-pay | Admitting: Internal Medicine

## 2015-11-13 VITALS — BP 104/62 | HR 59 | Temp 98.0°F | Resp 11 | Ht 66.0 in | Wt 216.0 lb

## 2015-11-13 DIAGNOSIS — Z1211 Encounter for screening for malignant neoplasm of colon: Secondary | ICD-10-CM

## 2015-11-13 MED ORDER — SODIUM CHLORIDE 0.9 % IV SOLN: 500.0000 mL | INTRAVENOUS | Status: DC

## 2015-11-13 NOTE — Op Note (Addendum)
[facility] Patient Name: [name] [name] Procedure Date: 11/13/2015 1:29 PM MRN: [id number] Endoscopist: Beverley Fiedler , MD Age: 50 Date of Birth: 19-Jun-1966 Gender: Female Procedure:                Colonoscopy Indications:              Screening for colorectal malignant neoplasm, Last                            colonoscopy 10 years ago was normal with Dr.                            Jarold Motto Medicines:                Monitored Anesthesia Care Procedure:                Pre-Anesthesia Assessment:                           - Prior to the procedure, a History and Physical                            was performed, and patient medications and                            allergies were reviewed. The patient's tolerance of                            previous anesthesia was also reviewed. The risks                            and benefits of the procedure and the sedation                            options and risks were discussed with the patient.                            All questions were answered, and informed consent                            was obtained. Prior Anticoagulants: The patient has                            taken no previous anticoagulant or antiplatelet                            agents. ASA Grade Assessment: II - A patient with                            mild systemic disease. After reviewing the risks                            and benefits, the patient was deemed in  satisfactory condition to undergo the procedure.                           After obtaining informed consent, the colonoscope                            was passed under direct vision. Throughout the                            procedure, the patient's blood pressure, pulse, and                            oxygen saturations were monitored continuously. The                            Model PCF-H190DL [id number](SN#[id number]) scope was introduced                            through the anus  and advanced to the the cecum,                            identified by appendiceal orifice and ileocecal                            valve. The colonoscopy was performed without                            difficulty. The patient tolerated the procedure                            well. The quality of the bowel preparation was                            good. The ileocecal valve, appendiceal orifice, and                            rectum were photographed. Scope In: 1:46:58 PM Scope Out: 1:56:47 PM Scope Withdrawal Time: 0 hours 7 minutes 12 seconds  Total Procedure Duration: 0 hours 9 minutes 49 seconds  Findings:                 The entire examined colon appeared normal.                           Anal papillae were hypertrophied on retroflexion. Complications:            No immediate complications. Estimated Blood Loss:     Estimated blood loss: none. Impression:               - The entire examined colon is normal.                           - Anal papillae were hypertrophied on retroflexion.                           - No specimens  collected. Recommendation:           - Patient has a contact number available for                            emergencies. The signs and symptoms of potential                            delayed complications were discussed with the                            patient. Return to normal activities tomorrow.                            Written discharge instructions were provided to the                            patient.                           - Resume previous diet.                           - Continue present medications.                           - Repeat colonoscopy in 10 years for screening                            purposes. Beverley Fiedler, MD 11/13/2015 2:01:52 PM This report has been signed electronically. CC Letter to:             Carmin Richmond, MD Addendum Number: 1   Addendum Date: 11/13/2015 2:32:01 PM      Pictures were obtained but failed to be  attached to this report. Beverley Fiedler, MD 11/13/2015 2:32:37 PM This report has been signed electronically.

## 2015-11-13 NOTE — Progress Notes (Signed)
To recovery, report to [place], RN, VSS 

## 2015-11-13 NOTE — Patient Instructions (Signed)
YOU HAD AN ENDOSCOPIC PROCEDURE TODAY AT THE [facility]:   Refer to the procedure report that was given to you for any specific questions about what was found during the examination.  If the procedure report does not answer your questions, please call your gastroenterologist to clarify.  If you requested that your care partner not be given the details of your procedure findings, then the procedure report has been included in a sealed envelope for you to review at your convenience later.  YOU SHOULD EXPECT: Some feelings of bloating in the abdomen. Passage of more gas than usual.  Walking can help get rid of the air that was put into your GI tract during the procedure and reduce the bloating. If you had a lower endoscopy (such as a colonoscopy or flexible sigmoidoscopy) you may notice spotting of blood in your stool or on the toilet paper. If you underwent a bowel prep for your procedure, you may not have a normal bowel movement for a few days.  Please Note:  You might notice some irritation and congestion in your nose or some drainage.  This is from the oxygen used during your procedure.  There is no need for concern and it should clear up in a day or so.  SYMPTOMS TO REPORT IMMEDIATELY:   Following lower endoscopy (colonoscopy or flexible sigmoidoscopy):  Excessive amounts of blood in the stool  Significant tenderness or worsening of abdominal pains  Swelling of the abdomen that is new, acute  Fever of 100F or higher    For urgent or emergent issues, a gastroenterologist can be reached at any hour by calling [phone].   DIET: Your first meal following the procedure should be a small meal and then it is ok to progress to your normal diet. Heavy or fried foods are harder to digest and may make you feel nauseous or bloated.  Likewise, meals heavy in dairy and vegetables can increase bloating.  Drink plenty of fluids but you should avoid alcoholic beverages for 24  hours.  ACTIVITY:  You should plan to take it easy for the rest of today and you should NOT DRIVE or use heavy machinery until tomorrow (because of the sedation medicines used during the test).    FOLLOW UP: Our staff will call the number listed on your records the next business day following your procedure to check on you and address any questions or concerns that you may have regarding the information given to you following your procedure. If we do not reach you, we will leave a message.  However, if you are feeling well and you are not experiencing any problems, there is no need to return our call.  We will assume that you have returned to your regular daily activities without incident.  If any biopsies were taken you will be contacted by phone or by letter within the next 1-3 weeks.  Please call us at [phone] if you have not heard about the biopsies in 3 weeks.    SIGNATURES/CONFIDENTIALITY: You and/or your care partner have signed paperwork which will be entered into your electronic medical record.  These signatures attest to the fact that that the information above on your After Visit Summary has been reviewed and is understood.  Full responsibility of the confidentiality of this discharge information lies with you and/or your care-partner.  Resume usual medications and usual diet

## 2015-11-14 ENCOUNTER — Telehealth: Payer: Self-pay | Admitting: *Deleted

## 2015-11-14 NOTE — Telephone Encounter (Signed)
  Follow up Call-  Call back number 11/13/2015  Post procedure Call Back phone  # [phone]  Permission to leave phone message Yes     Patient questions:  Do you have a fever, pain , or abdominal swelling? No. Pain Score  0 *  Have you tolerated food without any problems? Yes.    Have you been able to return to your normal activities? Yes.    Do you have any questions about your discharge instructions: Diet   No. Medications  No. Follow up visit  No.  Do you have questions or concerns about your Care? No.  Actions: * If pain score is 4 or above: No action needed, pain <4.

## 2019-02-08 ENCOUNTER — Other Ambulatory Visit: Payer: Self-pay | Admitting: Orthopedic Surgery

## 2019-02-08 DIAGNOSIS — M259 Joint disorder, unspecified: Secondary | ICD-10-CM

## 2019-02-18 ENCOUNTER — Ambulatory Visit
Admission: RE | Admit: 2019-02-18 | Discharge: 2019-02-18 | Disposition: A | Discharge status: Home / Self Care | Payer: BLUE CROSS/BLUE SHIELD | Source: Ambulatory Visit | Attending: Orthopedic Surgery | Admitting: Orthopedic Surgery

## 2019-02-18 ENCOUNTER — Ambulatory Visit
Admission: RE | Admit: 2019-02-18 | Discharge: 2019-02-18 | Disposition: A | Discharge status: Home / Self Care | Payer: 59 | Source: Ambulatory Visit | Attending: Orthopedic Surgery | Admitting: Orthopedic Surgery

## 2019-02-18 DIAGNOSIS — M259 Joint disorder, unspecified: Secondary | ICD-10-CM

## 2019-04-12 ENCOUNTER — Other Ambulatory Visit: Payer: Self-pay | Admitting: Orthopedic Surgery

## 2019-04-12 DIAGNOSIS — M5136 Other intervertebral disc degeneration, lumbar region: Secondary | ICD-10-CM

## 2019-04-20 ENCOUNTER — Other Ambulatory Visit: Payer: Self-pay | Admitting: Family Medicine

## 2019-04-20 ENCOUNTER — Ambulatory Visit
Admission: RE | Admit: 2019-04-20 | Discharge: 2019-04-20 | Disposition: A | Discharge status: Home / Self Care | Payer: Self-pay | Source: Ambulatory Visit | Attending: Family Medicine | Admitting: Family Medicine

## 2019-04-20 DIAGNOSIS — R52 Pain, unspecified: Secondary | ICD-10-CM

## 2019-04-22 ENCOUNTER — Other Ambulatory Visit: Payer: Self-pay

## 2019-04-22 ENCOUNTER — Ambulatory Visit
Admission: RE | Admit: 2019-04-22 | Discharge: 2019-04-22 | Disposition: A | Discharge status: Home / Self Care | Payer: 59 | Source: Ambulatory Visit | Attending: Orthopedic Surgery | Admitting: Orthopedic Surgery

## 2019-04-22 DIAGNOSIS — M5136 Other intervertebral disc degeneration, lumbar region: Secondary | ICD-10-CM

## 2019-04-22 MED ORDER — IOPAMIDOL (ISOVUE-M 200) INJECTION 41%
1.0000 mL | Freq: Once | INTRAMUSCULAR | Status: AC
  Administered 2019-04-22: 14:00:00 1 mL

## 2019-04-22 MED ORDER — CEFAZOLIN SODIUM-DEXTROSE 2-4 GM/100ML-% IV SOLN
2.0000 g | Freq: Once | INTRAVENOUS | Status: AC
  Administered 2019-04-22: 12:00:00 2 g via INTRAVENOUS

## 2019-04-22 NOTE — Discharge Instructions (Signed)

## 2019-04-28 ENCOUNTER — Other Ambulatory Visit: Payer: Self-pay | Admitting: Orthopedic Surgery

## 2019-05-06 ENCOUNTER — Ambulatory Visit: Payer: Self-pay | Admitting: Orthopedic Surgery

## 2019-05-13 ENCOUNTER — Encounter (HOSPITAL_COMMUNITY): Payer: Self-pay

## 2019-05-13 NOTE — Progress Notes (Signed)
[place], [place], [place]  [address] Phone: [phone] Fax: [phone]      Your procedure is scheduled on May 19, 2019.  Report to [facility] Main Entrance "A" at 11:30 A.M., and check in at the Admitting office.   Call this number if you have problems the morning of surgery:  [phone]  Call [phone] if you have any questions prior to your surgery date Monday-Friday 8am-4pm    Remember:  Do not eat or drink after midnight the night before your surgery   Take these medicines the morning of surgery with A SIP OF WATER:  Albuterol (Ventolin HFA) inhaler - if needed Venlafaxine XR (Effexor-XR)  7 days prior to surgery STOP taking any Aspirin (unless otherwise instructed by your surgeon), Aleve, Naproxen, Ibuprofen, Motrin, Advil, Goody's, BC's, all herbal medications, fish oil, and all vitamins.    The Morning of Surgery  Do not wear jewelry, make-up or nail polish.  Do not wear lotions, powders, or perfumes/colognes, or deodorant  Do not shave 48 hours prior to surgery.  Men may shave face and neck.  Do not bring valuables to the hospital.  [facility] is not responsible for any belongings or valuables.  If you are a smoker, DO NOT Smoke 24 hours prior to surgery IF you wear a CPAP at night please bring your mask, tubing, and machine the morning of surgery   Remember that you must have someone to transport you home after your surgery, and remain with you for 24 hours if you are discharged the same day.   Contacts, glasses, hearing aids, dentures or bridgework may not be worn into surgery.    Leave your suitcase in the car.  After surgery it may be brought to your room.  For patients admitted to the hospital, discharge time will be determined by your treatment team.  Patients discharged the day of surgery will not be allowed to drive home.    Special instructions:   [facility]- Preparing For Surgery  Before surgery, you can play an important role. Because skin is not sterile, your skin needs to be as free of germs as possible. You can reduce the number of germs on your skin by washing with CHG (chlorahexidine gluconate) Soap before surgery.  CHG is an antiseptic cleaner which kills germs and bonds with the skin to continue killing germs even after washing.    Oral Hygiene is also important to reduce your risk of infection.  Remember - BRUSH YOUR TEETH THE MORNING OF SURGERY WITH YOUR REGULAR TOOTHPASTE  Please do not use if you have an allergy to CHG or antibacterial soaps. If your skin becomes reddened/irritated stop using the CHG.  Do not shave (including legs and underarms) for at least 48 hours prior to first CHG shower. It is OK to shave your face.  Please follow these instructions carefully.   1. Shower the NIGHT BEFORE SURGERY and the MORNING OF SURGERY with CHG Soap.   2. If you chose to wash your hair, wash your hair first as usual with your normal shampoo.  3. After you shampoo, rinse your hair and body thoroughly to remove the shampoo.  4. Use CHG as you would any other liquid soap. You can apply CHG directly to the skin and wash gently with a scrungie or a clean washcloth.   5. Apply the CHG Soap to your  body ONLY FROM THE NECK DOWN.  Do not use on open wounds or open sores. Avoid contact with your eyes, ears, mouth and genitals (private parts). Wash Face and genitals (private parts)  with your normal soap.   6. Wash thoroughly, paying special attention to the area where your surgery will be performed.  7. Thoroughly rinse your body with warm water from the neck down.  8. DO NOT shower/wash with your normal soap after using and rinsing off the CHG Soap.  9. Pat yourself dry with a CLEAN TOWEL.  10. Wear CLEAN PAJAMAS to bed the night before surgery, wear comfortable clothes the morning of surgery  11. Place CLEAN SHEETS on your bed the  night of your first shower and DO NOT SLEEP WITH PETS.    Day of Surgery:  Do not apply any deodorants/lotions. Please shower the morning of surgery with the CHG soap  Please wear clean clothes to the hospital/surgery center.   Remember to brush your teeth WITH YOUR REGULAR TOOTHPASTE.   Please read over the following fact sheets that you were given.

## 2019-05-16 ENCOUNTER — Other Ambulatory Visit (HOSPITAL_COMMUNITY)
Admission: RE | Admit: 2019-05-16 | Discharge: 2019-05-16 | Disposition: A | Discharge status: Home / Self Care | Payer: 59 | Source: Ambulatory Visit | Attending: Orthopedic Surgery | Admitting: Orthopedic Surgery

## 2019-05-16 ENCOUNTER — Encounter (HOSPITAL_COMMUNITY): Payer: Self-pay

## 2019-05-16 ENCOUNTER — Other Ambulatory Visit: Payer: Self-pay

## 2019-05-16 ENCOUNTER — Encounter (HOSPITAL_COMMUNITY)
Admission: RE | Admit: 2019-05-16 | Discharge: 2019-05-16 | Disposition: A | Discharge status: Home / Self Care | Payer: 59 | Source: Ambulatory Visit | Attending: Orthopedic Surgery | Admitting: Orthopedic Surgery

## 2019-05-16 ENCOUNTER — Ambulatory Visit: Payer: Self-pay | Admitting: Orthopedic Surgery

## 2019-05-16 ENCOUNTER — Ambulatory Visit (HOSPITAL_COMMUNITY)
Admission: RE | Admit: 2019-05-16 | Discharge: 2019-05-16 | Disposition: A | Discharge status: Home / Self Care | Payer: 59 | Source: Ambulatory Visit | Attending: Orthopedic Surgery | Admitting: Orthopedic Surgery

## 2019-05-16 DIAGNOSIS — Z01818 Encounter for other preprocedural examination: Secondary | ICD-10-CM

## 2019-05-16 HISTORY — DX: Depression, unspecified: F32.A

## 2019-05-16 LAB — CBC
HCT: 39.6 % (ref 36.0–46.0)
Hemoglobin: 12.2 g/dL (ref 12.0–15.0)
MCH: 28 pg (ref 26.0–34.0)
MCHC: 30.8 g/dL (ref 30.0–36.0)
MCV: 90.8 fL (ref 80.0–100.0)
Platelets: 295 10*3/uL (ref 150–400)
RBC: 4.36 MIL/uL (ref 3.87–5.11)
RDW: 14.4 % (ref 11.5–15.5)
WBC: 6.9 10*3/uL (ref 4.0–10.5)
nRBC: 0 % (ref 0.0–0.2)

## 2019-05-16 LAB — URINALYSIS, ROUTINE W REFLEX MICROSCOPIC
Bilirubin Urine: NEGATIVE
Glucose, UA: NEGATIVE mg/dL
Hgb urine dipstick: NEGATIVE
Ketones, ur: NEGATIVE mg/dL
Leukocytes,Ua: NEGATIVE
Nitrite: NEGATIVE
Protein, ur: NEGATIVE mg/dL
Specific Gravity, Urine: 1.006 (ref 1.005–1.030)
pH: 6 (ref 5.0–8.0)

## 2019-05-16 LAB — TYPE AND SCREEN
ABO/RH(D): O POS
Antibody Screen: NEGATIVE

## 2019-05-16 LAB — PROTIME-INR
INR: 1 (ref 0.8–1.2)
Prothrombin Time: 13.5 seconds (ref 11.4–15.2)

## 2019-05-16 LAB — BASIC METABOLIC PANEL
Anion gap: 10 (ref 5–15)
BUN: 10 mg/dL (ref 6–20)
CO2: 24 mmol/L (ref 22–32)
Calcium: 9.6 mg/dL (ref 8.9–10.3)
Chloride: 102 mmol/L (ref 98–111)
Creatinine, Ser: 0.8 mg/dL (ref 0.44–1.00)
GFR calc Af Amer: >60 mL/min (ref >60)
GFR calc non Af Amer: >60 mL/min (ref >60)
Glucose, Bld: 95 mg/dL (ref 70–99)
Potassium: 3.8 mmol/L (ref 3.5–5.1)
Sodium: 136 mmol/L (ref 135–145)

## 2019-05-16 LAB — SURGICAL PCR SCREEN
MRSA, PCR: NEGATIVE
Staphylococcus aureus: NEGATIVE

## 2019-05-16 LAB — APTT: aPTT: 32 seconds (ref 24–36)

## 2019-05-16 LAB — ABO/RH: ABO/RH(D): O POS

## 2019-05-16 NOTE — Progress Notes (Signed)
PCP - Dr. Romie Minus Cardiologist - denies  PPM/ICD - N/A Device Orders -  Rep Notified -   Chest x-ray - 05/16/19 EKG - N/A Stress Test -05/2017-care everywhere  ECHO - denies Cardiac Cath - denies  Sleep Study - denies CPAP - denies  Blood Thinner Instructions:N/A Aspirin Instructions:N/A  ERAS Protcol -N/A PRE-SURGERY Ensure or G2-   COVID TEST- 05/16/19   Anesthesia review: No  Patient denies shortness of breath, fever, cough and chest pain at PAT appointment   All instructions explained to the patient, with a verbal understanding of the material. Patient agrees to go over the instructions while at home for a better understanding. Patient also instructed to self quarantine after being tested for COVID-19. The opportunity to ask questions was provided.   Coronavirus Screening  Have you experienced the following symptoms:  Cough yes/no: No Fever (>100.12F)  yes/no: No Runny nose yes/no: No Sore throat yes/no: No Difficulty breathing/shortness of breath  yes/no: No  Have you or a family member traveled in the last 14 days and where? yes/no: No   If the patient indicates "YES" to the above questions, their PAT will be rescheduled to limit the exposure to others and, the surgeon will be notified. THE PATIENT WILL NEED TO BE ASYMPTOMATIC FOR 14 DAYS.   If the patient is not experiencing any of these symptoms, the PAT nurse will instruct them to NOT bring anyone with them to their appointment since they may have these symptoms or traveled as well.   Please remind your patients and families that hospital visitation restrictions are in effect and the importance of the restrictions.

## 2019-05-16 NOTE — H&P (Deleted)
  The note originally documented on this encounter has been moved the the encounter in which it belongs.  

## 2019-05-16 NOTE — H&P (Signed)
Subjective:   [name] [name] is a pleasant 53 year old female with past medical history significant for previous L4-S1 Fusion, Previous L3-4 decompression Who has been experiencing low back pain radicular right leg pain as well as left leg dysesthesias for some time. Despite conservative treatment measures including Injection therapy, Medications the patient continues to have severe debilitating pain and would like to move forward with surgical intervention. She is scheduled for an XLIF L3-4 on 05/19/19 at [facility] With Dr. Shon BatonBrooks.  We have obtain surgical clearance from her primary care provider Patient has appointment set up with physical therapy today to be fitted for LSO brace Patient has [facility] for [facility] testing  Patient Active Problem List   Diagnosis Date Noted  . Neck pain 05/24/2015  . Pelvic pain in female 03/09/2015    Class: Present on Admission  . S/P bilateral salpingo-oophorectomy 03/09/2015    Class: Status post  . GERD 10/01/2010  . DYSPHAGIA UNSPECIFIED 10/01/2010  . EPIGASTRIC PAIN 10/01/2010  . GERD 10/01/2010  . DYSPHAGIA UNSPECIFIED 10/01/2010  . EPIGASTRIC PAIN 10/01/2010  . CERVICAL CANCER, HX OF 09/30/2010  . ADENOCARCINOMA, UTERUS, HX OF 09/30/2010  . CERVICAL CANCER, HX OF 09/30/2010  . ADENOCARCINOMA, UTERUS, HX OF 09/30/2010   Past Medical History:  Diagnosis Date  . Anemia   . Chronic neck pain    and arm pain  . Complication of anesthesia    difficulty breathing with Epidural during c -seaction  . DDD (degenerative disc disease), cervical   . History of abnormal cervical Pap smear    hx chronic --  s/p vag. hysterectomy 1996  . Pelvic pain in female   . Wears contact lenses     Past Surgical History:  Procedure Laterality Date  . ANTERIOR CERVICAL DECOMP/DISCECTOMY FUSION  2002   C5 -- 6  . ANTERIOR CERVICAL DECOMP/DISCECTOMY FUSION N/A 05/24/2015   Procedure: REMOVAL OF CERVICAL HARDWARE C5-6 ACDF C6-7;   Surgeon: Venita Lickahari Brooks, MD;  Location: [facility] OR;  Service: Orthopedics;  Laterality: N/A;  . CERVICAL DISC SURGERY  05/24/2015   C5 C6 C7  . CESAREAN SECTION  1987  . COLONOSCOPY  2007  . CYSTOSCOPY N/A 03/09/2015   Procedure: CYSTOSCOPY WITH INJECTION OF VAGINAL CUFF;  Surgeon: Richardean ChimeraJohn McComb, MD;  [facility] [place];  Service: Gynecology;  Laterality: N/A;  . HARDWARE REMOVAL N/A 05/24/2015   Procedure: HARDWARE REMOVAL;  Surgeon: Venita Lickahari Brooks, MD;  Location: [facility] OR;  Service: Orthopedics;  Laterality: N/A;  . LAPAROSCOPIC SALPINGO OOPHERECTOMY Bilateral 03/09/2015   Procedure: LAPAROSCOPIC SALPINGO OOPHORECTOMY;  Surgeon: Richardean ChimeraJohn McComb, MD;  [facility] [place];  Service: Gynecology;  Laterality: Bilateral;  . POSTERIOR LUMBAR FUSION  1999  &  2000   L4 - 5  &  L5 - S1  . TONSILLECTOMY  1991  . VAGINAL HYSTERECTOMY  1996    Current Outpatient Medications  Medication Sig Dispense Refill Last Dose  . albuterol (VENTOLIN HFA) 108 (90 Base) MCG/ACT inhaler Inhale 2 puffs into the lungs every 6 (six) hours as needed for wheezing.     Marland Kitchen. ALPRAZolam (XANAX) 0.25 MG tablet Take 0.25 mg by mouth at bedtime as needed for anxiety or sleep.     Marland Kitchen. venlafaxine XR (EFFEXOR-XR) 75 MG 24 hr capsule Take 75 mg by mouth daily with breakfast.      No current facility-administered medications for this visit.    Allergies  Allergen Reactions  . Codeine Itching  . Contrast  Media [Iodinated Diagnostic Agents] Itching    Social History   Tobacco Use  . Smoking status: Never Smoker  . Smokeless tobacco: Never Used  Substance Use Topics  . Alcohol use: No    Alcohol/week: 0.0 standard drinks    Family History  Problem Relation Age of Onset  . Colon polyps Father   . Colon polyps Sister     Review of Systems As stated in HPI  Objective:   Vitals: Ht: 5 ft 5.75 in Wt: 201 lbs BMI: 32.7 BP: 167/82 Pulse: 105 bpm T: 98.1 F  Clinical exam: [name] [name] returns today for  follow-up status post her intradiscal Marcaine injection  Inspection: No obvious deformity  Ambulation: Normal, no assistive devices  Heart: Regular rate and, no rubs, murmurs, or gallops  Lungs: Clear to auscultation bilaterally  Abdomen: Bowel sounds 4, nontender, nondistended, no hepatosplenomegaly  Range of motion: Pain elicited with flexion and extension of lumbar spine. Isolated hip, knee, ankle range of motion normal and pain-free bilaterally  Dermatomes: Decreased sensation to light touch in the L3-4 dermatome pattern bilaterally  Myotomes: 5/5 lower extremity motor strength bilaterally  Reflexes: 2+ bilateral patella and Achilles reflexes. Negative Babinski, negative clonus  Peripheral vascular: Extremities warm and well perfused, palpable dorsal pedis and posterior tib pulses.  Patient reports near 100% relief of her pain shortly after the injection. She states her quality-of-life was markedly improved but unfortunately was only temporary. The pain is begun to increase. She remains neurologically intact except for the dysesthesias and pain radiating into the right lower extremity.  Previous marking only CT-guided SI joints gave no significant relief  Lumbar MRI completed on 03/08/19 was also reviewed. She had partial inferior L3 laminectomies. There is a large right anterior medial directed synovial cyst severe bulging disc asymmetric to the right with the right foraminal extraforaminal annular fissure and severe bilateral facet arthropathy. There is moderate neural foraminal narrowing with contact of exiting bilateral L3 nerve roots. L4-5 and L5-S1: L4 laminectomies and discectomies with interbody cage device. No spinal canal stenosis or neuroforaminal narrowing. L5-S1: Small central left disc protrusion contacts the descending left S1 nerve root without displacement of spinal canal.  X-rays of the lumbar spine indicate a solid fusion L4-5 possible L4-5 S1. There is a broken  screw at S1. Slight anterior listhesis at L3-4.   Assessment:   [name] [name] is a pleasant 53 year old female with past medical history significant for previous L4-S1 Fusion, Previous L3-4 decompression Who has been experiencing low back pain radicular right leg pain as well as left leg dysesthesias for some time.  The patient had near 100% complete relief of her back pain following the intradiscal injection. Unfortunately it has begun to wear off. However this does indicate that her primary pain generator is the L3-4 disc. While she does have some dysesthesias radiating into the right lower extremity I do not think formal posterior decompression is warranted at this time. I do believe with a lateral interbody fusion we can restore intra-foraminal and central canal volume allowing for indirect decompression of the neural elements. I have gone over the proposed surgery with her in great detail. We will plan on doing lateral interbody fusion at L3-4 with application of a lateral plate. This would avoid the need to go posterior and remove her existing hardware. He would also avoid significant scar tissue from her previous surgeries.  Despite conservative measures, the patient's pain continues and is severe and debilitating and therefore she would like to move forward  with surgical intervention.  Plan:   L3-4 XLIF  The goal of surgery is to reduce not eliminate her pain and improve her quality-of-life.   We have gone over the risks and benefits of surgery and she is expressed an understanding and a desire to continue forward with the surgical plan. Risks, benefits of surgery were reviewed with the patient. These include: infection, bleeding, death, stroke, paralysis, ongoing or worse pain, need for additional surgery, injury to the lumbar plexus resulting in hip flexor weakness and difficulty walking without assistive devices. Adjacent segment degenerative disease, need for additional surgery including  fusing other levels, leak of spinal fluid, Nonunion, hardware failure, breakage, or mal-position. Deep venous thrombosis (DVT) requiring additional treatment such as filter, and/or medications. Injury to abdominal contents, loss in bowel and bladder control.  We have also discussed the post-operative recovery period to include: bathing/showering restrictions, wound healing, activity (and driving) restrictions, medications/pain mangement.  We have also discussed post-operative redflags to include: signs and symptoms of postoperative infection, DVT/PE.  Patient denies any blood thinners. I advised her to discontinue any anti-inflammatory medication for 1 week prior to surgery. We did the PCP clearance, she is getting fitted for LSO brace and she does have an appointment scheduled at [facility], for preoperative testing.  We will move forward with the procedure as scheduled pending labs for [facility]  Follow-up: 2 weeks postoperatively

## 2019-05-17 LAB — NOVEL CORONAVIRUS, NAA (HOSP ORDER, SEND-OUT TO REF LAB; TAT 18-24 HRS): SARS-CoV-2, NAA: NOT DETECTED

## 2019-05-19 ENCOUNTER — Other Ambulatory Visit: Payer: Self-pay

## 2019-05-19 ENCOUNTER — Inpatient Hospital Stay (HOSPITAL_COMMUNITY): Payer: 59

## 2019-05-19 ENCOUNTER — Encounter (HOSPITAL_COMMUNITY)
Admission: RE | Disposition: A | Discharge status: Home / Self Care | Payer: Self-pay | Source: Ambulatory Visit | Attending: Orthopedic Surgery

## 2019-05-19 ENCOUNTER — Inpatient Hospital Stay (HOSPITAL_COMMUNITY): Payer: 59 | Admitting: Anesthesiology

## 2019-05-19 ENCOUNTER — Encounter (HOSPITAL_COMMUNITY): Payer: Self-pay

## 2019-05-19 ENCOUNTER — Inpatient Hospital Stay (HOSPITAL_COMMUNITY)
Admission: RE | Admit: 2019-05-19 | Discharge: 2019-05-20 | DRG: 455 | Disposition: A | Discharge status: Home / Self Care | Payer: 59 | Source: Ambulatory Visit | Attending: Orthopedic Surgery | Admitting: Orthopedic Surgery

## 2019-05-19 DIAGNOSIS — M545 Low back pain: Secondary | ICD-10-CM | POA: Diagnosis present

## 2019-05-19 DIAGNOSIS — Z8541 Personal history of malignant neoplasm of cervix uteri: Secondary | ICD-10-CM

## 2019-05-19 DIAGNOSIS — Z91041 Radiographic dye allergy status: Secondary | ICD-10-CM

## 2019-05-19 DIAGNOSIS — Z01812 Encounter for preprocedural laboratory examination: Secondary | ICD-10-CM

## 2019-05-19 DIAGNOSIS — Z981 Arthrodesis status: Secondary | ICD-10-CM

## 2019-05-19 DIAGNOSIS — Z79899 Other long term (current) drug therapy: Secondary | ICD-10-CM | POA: Diagnosis not present

## 2019-05-19 DIAGNOSIS — M4327 Fusion of spine, lumbosacral region: Secondary | ICD-10-CM | POA: Diagnosis present

## 2019-05-19 DIAGNOSIS — K219 Gastro-esophageal reflux disease without esophagitis: Secondary | ICD-10-CM | POA: Diagnosis present

## 2019-05-19 DIAGNOSIS — Z20828 Contact with and (suspected) exposure to other viral communicable diseases: Secondary | ICD-10-CM | POA: Diagnosis present

## 2019-05-19 DIAGNOSIS — Z90710 Acquired absence of both cervix and uterus: Secondary | ICD-10-CM | POA: Diagnosis not present

## 2019-05-19 DIAGNOSIS — M5136 Other intervertebral disc degeneration, lumbar region: Principal | ICD-10-CM | POA: Diagnosis present

## 2019-05-19 DIAGNOSIS — M5030 Other cervical disc degeneration, unspecified cervical region: Secondary | ICD-10-CM | POA: Diagnosis present

## 2019-05-19 DIAGNOSIS — Z885 Allergy status to narcotic agent status: Secondary | ICD-10-CM

## 2019-05-19 DIAGNOSIS — Z419 Encounter for procedure for purposes other than remedying health state, unspecified: Secondary | ICD-10-CM

## 2019-05-19 HISTORY — PX: ANTERIOR LAT LUMBAR FUSION: SHX1168

## 2019-05-19 SURGERY — ANTERIOR LATERAL LUMBAR FUSION 1 LEVEL
Anesthesia: General

## 2019-05-19 MED ORDER — GABAPENTIN 300 MG PO CAPS
300.0000 mg | ORAL_CAPSULE | Freq: Three times a day (TID) | ORAL | Status: DC
  Administered 2019-05-19 – 2019-05-20 (×2): 300 mg via ORAL
  Filled 2019-05-19 (×2): qty 1

## 2019-05-19 MED ORDER — OXYCODONE-ACETAMINOPHEN 10-325 MG PO TABS: 1.0000 | ORAL_TABLET | Freq: Four times a day (QID) | ORAL | 0 refills | Status: AC | PRN

## 2019-05-19 MED ORDER — MIDAZOLAM HCL 2 MG/2ML IJ SOLN
INTRAMUSCULAR | Status: AC
  Filled 2019-05-19: qty 2

## 2019-05-19 MED ORDER — ALBUTEROL SULFATE (2.5 MG/3ML) 0.083% IN NEBU: 3.0000 mL | INHALATION_SOLUTION | Freq: Four times a day (QID) | RESPIRATORY_TRACT | Status: DC | PRN

## 2019-05-19 MED ORDER — BUPIVACAINE-EPINEPHRINE 0.25% -1:200000 IJ SOLN
INTRAMUSCULAR | Status: DC | PRN
  Administered 2019-05-19: 10 mL

## 2019-05-19 MED ORDER — MENTHOL 3 MG MT LOZG: 1.0000 | LOZENGE | OROMUCOSAL | Status: DC | PRN

## 2019-05-19 MED ORDER — ONDANSETRON HCL 4 MG/2ML IJ SOLN: 4.0000 mg | Freq: Four times a day (QID) | INTRAMUSCULAR | Status: DC | PRN

## 2019-05-19 MED ORDER — TRANEXAMIC ACID-NACL 1000-0.7 MG/100ML-% IV SOLN
INTRAVENOUS | Status: AC
  Filled 2019-05-19: qty 100

## 2019-05-19 MED ORDER — HEPARIN SOD (PORK) LOCK FLUSH 100 UNIT/ML IV SOLN
INTRAVENOUS | Status: AC
  Filled 2019-05-19: qty 5

## 2019-05-19 MED ORDER — HYDROMORPHONE HCL 1 MG/ML IJ SOLN
0.2500 mg | INTRAMUSCULAR | Status: DC | PRN
  Administered 2019-05-19 (×4): 0.5 mg via INTRAVENOUS

## 2019-05-19 MED ORDER — ACETAMINOPHEN 650 MG RE SUPP: 650.0000 mg | RECTAL | Status: DC | PRN

## 2019-05-19 MED ORDER — POLYETHYLENE GLYCOL 3350 17 G PO PACK: 17.0000 g | PACK | Freq: Every day | ORAL | Status: DC | PRN

## 2019-05-19 MED ORDER — FENTANYL CITRATE (PF) 100 MCG/2ML IJ SOLN
INTRAMUSCULAR | Status: DC | PRN
  Administered 2019-05-19 (×2): 50 ug via INTRAVENOUS
  Administered 2019-05-19: 100 ug via INTRAVENOUS
  Administered 2019-05-19: 50 ug via INTRAVENOUS

## 2019-05-19 MED ORDER — BUPIVACAINE LIPOSOME 1.3 % IJ SUSP
INTRAMUSCULAR | Status: DC | PRN
  Administered 2019-05-19: 10 mL

## 2019-05-19 MED ORDER — FENTANYL CITRATE (PF) 100 MCG/2ML IJ SOLN
50.0000 ug | Freq: Once | INTRAMUSCULAR | Status: AC
  Administered 2019-05-19: 13:00:00 50 ug via INTRAVENOUS

## 2019-05-19 MED ORDER — MORPHINE SULFATE (PF) 2 MG/ML IV SOLN
2.0000 mg | INTRAVENOUS | Status: DC | PRN
  Administered 2019-05-19 – 2019-05-20 (×2): 2 mg via INTRAVENOUS
  Filled 2019-05-19 (×2): qty 1

## 2019-05-19 MED ORDER — PROPOFOL 500 MG/50ML IV EMUL
INTRAVENOUS | Status: DC | PRN
  Administered 2019-05-19: 100 ug/kg/min via INTRAVENOUS

## 2019-05-19 MED ORDER — ACETAMINOPHEN 325 MG PO TABS
650.0000 mg | ORAL_TABLET | ORAL | Status: DC | PRN
  Administered 2019-05-19 – 2019-05-20 (×2): 650 mg via ORAL
  Filled 2019-05-19 (×2): qty 2

## 2019-05-19 MED ORDER — HEPARIN SOD (PORK) LOCK FLUSH 100 UNIT/ML IV SOLN
INTRAVENOUS | Status: DC | PRN
  Administered 2019-05-19: 500 [IU] via INTRAVENOUS

## 2019-05-19 MED ORDER — OXYCODONE HCL 5 MG PO TABS
10.0000 mg | ORAL_TABLET | ORAL | Status: DC | PRN
  Administered 2019-05-19 – 2019-05-20 (×5): 10 mg via ORAL
  Filled 2019-05-19 (×4): qty 2

## 2019-05-19 MED ORDER — LACTATED RINGERS IV SOLN
INTRAVENOUS | Status: DC
  Administered 2019-05-19 (×2): via INTRAVENOUS

## 2019-05-19 MED ORDER — CEFAZOLIN SODIUM-DEXTROSE 2-4 GM/100ML-% IV SOLN
2.0000 g | INTRAVENOUS | Status: AC
  Administered 2019-05-19: 2 g via INTRAVENOUS
  Filled 2019-05-19: qty 100

## 2019-05-19 MED ORDER — SODIUM CHLORIDE 0.9 % IV SOLN
INTRAVENOUS | Status: DC | PRN
  Administered 2019-05-19: 30 ug/min via INTRAVENOUS

## 2019-05-19 MED ORDER — METHOCARBAMOL 500 MG PO TABS: 500.0000 mg | ORAL_TABLET | Freq: Three times a day (TID) | ORAL | 0 refills | Status: AC | PRN

## 2019-05-19 MED ORDER — ONDANSETRON HCL 4 MG/2ML IJ SOLN
INTRAMUSCULAR | Status: AC
  Filled 2019-05-19: qty 2

## 2019-05-19 MED ORDER — METHOCARBAMOL 500 MG PO TABS
500.0000 mg | ORAL_TABLET | Freq: Four times a day (QID) | ORAL | Status: DC | PRN
  Administered 2019-05-19 – 2019-05-20 (×3): 500 mg via ORAL
  Filled 2019-05-19 (×2): qty 1

## 2019-05-19 MED ORDER — FENTANYL CITRATE (PF) 100 MCG/2ML IJ SOLN
INTRAMUSCULAR | Status: AC
  Administered 2019-05-19: 50 ug via INTRAVENOUS
  Filled 2019-05-19: qty 2

## 2019-05-19 MED ORDER — GLYCOPYRROLATE 0.2 MG/ML IJ SOLN
INTRAMUSCULAR | Status: DC | PRN
  Administered 2019-05-19 (×2): 0.1 mg via INTRAVENOUS

## 2019-05-19 MED ORDER — OXYCODONE HCL 5 MG PO TABS
ORAL_TABLET | ORAL | Status: AC
  Filled 2019-05-19: qty 3

## 2019-05-19 MED ORDER — BUPIVACAINE HCL (PF) 0.5 % IJ SOLN
INTRAMUSCULAR | Status: DC | PRN
  Administered 2019-05-19: 15 mL via PERINEURAL

## 2019-05-19 MED ORDER — OXYCODONE HCL 5 MG PO TABS
5.0000 mg | ORAL_TABLET | Freq: Once | ORAL | Status: AC | PRN
  Administered 2019-05-19: 5 mg via ORAL

## 2019-05-19 MED ORDER — METHOCARBAMOL 1000 MG/10ML IJ SOLN
500.0000 mg | Freq: Four times a day (QID) | INTRAVENOUS | Status: DC | PRN
  Filled 2019-05-19: qty 5

## 2019-05-19 MED ORDER — CEFAZOLIN SODIUM-DEXTROSE 1-4 GM/50ML-% IV SOLN
1.0000 g | Freq: Three times a day (TID) | INTRAVENOUS | Status: AC
  Administered 2019-05-19 – 2019-05-20 (×2): 1 g via INTRAVENOUS
  Filled 2019-05-19 (×2): qty 50

## 2019-05-19 MED ORDER — HYDROMORPHONE HCL 1 MG/ML IJ SOLN
INTRAMUSCULAR | Status: AC
  Filled 2019-05-19: qty 0.5

## 2019-05-19 MED ORDER — PHENOL 1.4 % MT LIQD: 1.0000 | OROMUCOSAL | Status: DC | PRN

## 2019-05-19 MED ORDER — HEMOSTATIC AGENTS (NO CHARGE) OPTIME
TOPICAL | Status: DC | PRN
  Administered 2019-05-19: 1 via TOPICAL

## 2019-05-19 MED ORDER — MIDAZOLAM HCL 2 MG/2ML IJ SOLN
INTRAMUSCULAR | Status: AC
  Administered 2019-05-19: 2 mg via INTRAVENOUS
  Filled 2019-05-19: qty 2

## 2019-05-19 MED ORDER — ALPRAZOLAM 0.25 MG PO TABS: 0.2500 mg | ORAL_TABLET | Freq: Every evening | ORAL | Status: DC | PRN

## 2019-05-19 MED ORDER — PROPOFOL 10 MG/ML IV BOLUS
INTRAVENOUS | Status: AC
  Filled 2019-05-19: qty 20

## 2019-05-19 MED ORDER — MAGNESIUM CITRATE PO SOLN
1.0000 | Freq: Once | ORAL | Status: AC | PRN
  Administered 2019-05-19: 1 via ORAL
  Filled 2019-05-19: qty 296

## 2019-05-19 MED ORDER — STERILE WATER FOR IRRIGATION IR SOLN
Status: DC | PRN
  Administered 2019-05-19: 1000 mL

## 2019-05-19 MED ORDER — LIDOCAINE 2% (20 MG/ML) 5 ML SYRINGE
INTRAMUSCULAR | Status: DC | PRN
  Administered 2019-05-19: 60 mg via INTRAVENOUS

## 2019-05-19 MED ORDER — DEXAMETHASONE SODIUM PHOSPHATE 10 MG/ML IJ SOLN
INTRAMUSCULAR | Status: AC
  Filled 2019-05-19: qty 1

## 2019-05-19 MED ORDER — SODIUM CHLORIDE 0.9% FLUSH: 3.0000 mL | Freq: Two times a day (BID) | INTRAVENOUS | Status: DC

## 2019-05-19 MED ORDER — DEXAMETHASONE SODIUM PHOSPHATE 10 MG/ML IJ SOLN
INTRAMUSCULAR | Status: DC | PRN
  Administered 2019-05-19: 10 mg via INTRAVENOUS

## 2019-05-19 MED ORDER — ONDANSETRON HCL 4 MG/2ML IJ SOLN: 4.0000 mg | Freq: Once | INTRAMUSCULAR | Status: DC | PRN

## 2019-05-19 MED ORDER — PROPOFOL 1000 MG/100ML IV EMUL
INTRAVENOUS | Status: AC
  Filled 2019-05-19: qty 200

## 2019-05-19 MED ORDER — ACETAMINOPHEN 10 MG/ML IV SOLN
INTRAVENOUS | Status: AC
  Filled 2019-05-19: qty 100

## 2019-05-19 MED ORDER — ONDANSETRON HCL 4 MG PO TABS: 4.0000 mg | ORAL_TABLET | Freq: Four times a day (QID) | ORAL | Status: DC | PRN

## 2019-05-19 MED ORDER — SODIUM CHLORIDE (PF) 0.9 % IJ SOLN
INTRAMUSCULAR | Status: DC | PRN
  Administered 2019-05-19: 5 mL via INTRAVENOUS

## 2019-05-19 MED ORDER — OXYCODONE HCL 5 MG PO TABS: 5.0000 mg | ORAL_TABLET | ORAL | Status: DC | PRN

## 2019-05-19 MED ORDER — MIDAZOLAM HCL 2 MG/2ML IJ SOLN
2.0000 mg | Freq: Once | INTRAMUSCULAR | Status: AC
  Administered 2019-05-19: 13:00:00 2 mg via INTRAVENOUS

## 2019-05-19 MED ORDER — LIDOCAINE 2% (20 MG/ML) 5 ML SYRINGE
INTRAMUSCULAR | Status: AC
  Filled 2019-05-19: qty 5

## 2019-05-19 MED ORDER — LACTATED RINGERS IV SOLN: INTRAVENOUS | Status: DC

## 2019-05-19 MED ORDER — SUCCINYLCHOLINE CHLORIDE 20 MG/ML IJ SOLN
INTRAMUSCULAR | Status: DC | PRN
  Administered 2019-05-19: 100 mg via INTRAVENOUS

## 2019-05-19 MED ORDER — VENLAFAXINE HCL ER 75 MG PO CP24
75.0000 mg | ORAL_CAPSULE | Freq: Every day | ORAL | Status: DC
  Administered 2019-05-20: 08:00:00 75 mg via ORAL
  Filled 2019-05-19: qty 1

## 2019-05-19 MED ORDER — 0.9 % SODIUM CHLORIDE (POUR BTL) OPTIME
TOPICAL | Status: DC | PRN
  Administered 2019-05-19: 1000 mL

## 2019-05-19 MED ORDER — ONDANSETRON HCL 4 MG PO TABS: 4.0000 mg | ORAL_TABLET | Freq: Three times a day (TID) | ORAL | 0 refills | Status: DC | PRN

## 2019-05-19 MED ORDER — ROCURONIUM BROMIDE 10 MG/ML (PF) SYRINGE
PREFILLED_SYRINGE | INTRAVENOUS | Status: AC
  Filled 2019-05-19: qty 10

## 2019-05-19 MED ORDER — METHOCARBAMOL 500 MG PO TABS
ORAL_TABLET | ORAL | Status: AC
  Filled 2019-05-19: qty 1

## 2019-05-19 MED ORDER — ACETAMINOPHEN 10 MG/ML IV SOLN
INTRAVENOUS | Status: DC | PRN
  Administered 2019-05-19: 1000 mg via INTRAVENOUS

## 2019-05-19 MED ORDER — FENTANYL CITRATE (PF) 250 MCG/5ML IJ SOLN
INTRAMUSCULAR | Status: AC
  Filled 2019-05-19: qty 5

## 2019-05-19 MED ORDER — HYDROMORPHONE HCL 1 MG/ML IJ SOLN
INTRAMUSCULAR | Status: AC
  Filled 2019-05-19: qty 2

## 2019-05-19 MED ORDER — SODIUM CHLORIDE 0.9% FLUSH: 3.0000 mL | INTRAVENOUS | Status: DC | PRN

## 2019-05-19 MED ORDER — PROPOFOL 10 MG/ML IV BOLUS
INTRAVENOUS | Status: DC | PRN
  Administered 2019-05-19: 150 mg via INTRAVENOUS
  Administered 2019-05-19: 20 mg via INTRAVENOUS

## 2019-05-19 MED ORDER — ONDANSETRON HCL 4 MG/2ML IJ SOLN
INTRAMUSCULAR | Status: DC | PRN
  Administered 2019-05-19: 4 mg via INTRAVENOUS

## 2019-05-19 MED ORDER — OXYCODONE HCL 5 MG/5ML PO SOLN: 5.0000 mg | Freq: Once | ORAL | Status: AC | PRN

## 2019-05-19 SURGICAL SUPPLY — 71 items
AGENT HMST KT MTR STRL THRMB (HEMOSTASIS) ×1
BLADE CLIPPER SURG (BLADE) IMPLANT
BLADE SURG 10 STRL SS (BLADE) ×3 IMPLANT
BOLT PLATE XLIF 5.5X55 LRG (Bolt) ×2 IMPLANT
BOLT PLATE XLIF 5.5X55MM LRG (Bolt) ×1 IMPLANT
CATH FOLEY 2WAY SLVR  5CC 16FR (CATHETERS) ×2
CATH FOLEY 2WAY SLVR 5CC 16FR (CATHETERS) ×1 IMPLANT
CLOSURE STERI-STRIP 1/2X4 (GAUZE/BANDAGES/DRESSINGS) ×1
CLSR STERI-STRIP ANTIMIC 1/2X4 (GAUZE/BANDAGES/DRESSINGS) ×2 IMPLANT
COVER SURGICAL LIGHT HANDLE (MISCELLANEOUS) ×3 IMPLANT
COVER WAND RF STERILE (DRAPES) ×3 IMPLANT
DRAPE C-ARM 42X72 X-RAY (DRAPES) ×3 IMPLANT
DRAPE C-ARMOR (DRAPES) ×3 IMPLANT
DRAPE INCISE IOBAN 66X45 STRL (DRAPES) IMPLANT
DRSG OPSITE POSTOP 4X6 (GAUZE/BANDAGES/DRESSINGS) ×3 IMPLANT
DRSG OPSITE POSTOP 4X8 (GAUZE/BANDAGES/DRESSINGS) ×3 IMPLANT
DURAPREP 26ML APPLICATOR (WOUND CARE) ×3 IMPLANT
ELECT BLADE 4.0 EZ CLEAN MEGAD (MISCELLANEOUS) ×3
ELECT PENCIL ROCKER SW 15FT (MISCELLANEOUS) ×3 IMPLANT
ELECT REM PT RETURN 9FT ADLT (ELECTROSURGICAL) ×3
ELECTRODE BLDE 4.0 EZ CLN MEGD (MISCELLANEOUS) ×1 IMPLANT
ELECTRODE REM PT RTRN 9FT ADLT (ELECTROSURGICAL) ×1 IMPLANT
GLOVE BIO SURGEON STRL SZ 6.5 (GLOVE) ×2 IMPLANT
GLOVE BIO SURGEONS STRL SZ 6.5 (GLOVE) ×1
GLOVE BIOGEL PI IND STRL 6.5 (GLOVE) ×1 IMPLANT
GLOVE BIOGEL PI IND STRL 8.5 (GLOVE) ×1 IMPLANT
GLOVE BIOGEL PI INDICATOR 6.5 (GLOVE) ×2
GLOVE BIOGEL PI INDICATOR 8.5 (GLOVE) ×2
GLOVE SS BIOGEL STRL SZ 8.5 (GLOVE) ×1 IMPLANT
GLOVE SUPERSENSE BIOGEL SZ 8.5 (GLOVE) ×2
GOWN STRL REUS W/ TWL LRG LVL3 (GOWN DISPOSABLE) ×2 IMPLANT
GOWN STRL REUS W/ TWL XL LVL3 (GOWN DISPOSABLE) ×2 IMPLANT
GOWN STRL REUS W/TWL 2XL LVL3 (GOWN DISPOSABLE) ×6 IMPLANT
GOWN STRL REUS W/TWL LRG LVL3 (GOWN DISPOSABLE) ×6
GOWN STRL REUS W/TWL XL LVL3 (GOWN DISPOSABLE) ×6
KIT BASIN OR (CUSTOM PROCEDURE TRAY) ×3 IMPLANT
KIT BONE MRW ASP ANGEL CPRP (KITS) ×3 IMPLANT
KIT DILATOR XLIF 5 (KITS) ×2 IMPLANT
KIT SURGICAL ACCESS MAXCESS 4 (KITS) ×3 IMPLANT
KIT TURNOVER KIT B (KITS) ×3 IMPLANT
KIT XLIF (KITS) ×1
MODULE EMG NDL SSEP NVM5 (NEEDLE) IMPLANT
MODULE EMG NEEDLE SSEP NVM5 (NEEDLE) ×3 IMPLANT
MODULE NVM5 NEXT GEN EMG (NEEDLE) ×2 IMPLANT
MODULUS XLW 12X22X50MM 10DEG (Spine Construct) ×2 IMPLANT
NEEDLE 22X1 1/2 (OR ONLY) (NEEDLE) ×3 IMPLANT
NEEDLE SPNL 18GX3.5 QUINCKE PK (NEEDLE) ×3 IMPLANT
NS IRRIG 1000ML POUR BTL (IV SOLUTION) ×3 IMPLANT
PACK LAMINECTOMY ORTHO (CUSTOM PROCEDURE TRAY) ×3 IMPLANT
PACK UNIVERSAL I (CUSTOM PROCEDURE TRAY) ×3 IMPLANT
PAD ARMBOARD 7.5X6 YLW CONV (MISCELLANEOUS) ×6 IMPLANT
PIN FIXATION XLIF DECADE (PIN) ×3 IMPLANT
PLATE DECADE XLIP 2H SZ12 (Plate) ×3 IMPLANT
PUTTY DBM ALLOSYNC PURE 10CC (Putty) ×3 IMPLANT
PUTTY DBM ALLOSYNC PURE 5CC (Putty) ×3 IMPLANT
SCREW DECADE 5.5X50 (Screw) ×2 IMPLANT
SPONGE LAP 4X18 RFD (DISPOSABLE) ×3 IMPLANT
SPONGE SURGIFOAM ABS GEL 100 (HEMOSTASIS) ×3 IMPLANT
STAPLER VISISTAT 35W (STAPLE) ×3 IMPLANT
SURGIFLO W/THROMBIN 8M KIT (HEMOSTASIS) ×3 IMPLANT
SUT BONE WAX W31G (SUTURE) IMPLANT
SUT MON AB 3-0 SH 27 (SUTURE) ×6
SUT MON AB 3-0 SH27 (SUTURE) ×2 IMPLANT
SUT VIC AB 1 CT1 18XCR BRD 8 (SUTURE) ×2 IMPLANT
SUT VIC AB 1 CT1 8-18 (SUTURE) ×6
SUT VIC AB 2-0 CT1 18 (SUTURE) ×6 IMPLANT
SYR BULB IRRIGATION 50ML (SYRINGE) ×3 IMPLANT
TAPE CLOTH 4X10 WHT NS (GAUZE/BANDAGES/DRESSINGS) ×3 IMPLANT
TOWEL GREEN STERILE (TOWEL DISPOSABLE) ×3 IMPLANT
TOWEL GREEN STERILE FF (TOWEL DISPOSABLE) ×3 IMPLANT
WATER STERILE IRR 1000ML POUR (IV SOLUTION) ×3 IMPLANT

## 2019-05-19 NOTE — Brief Op Note (Signed)
05/19/2019  4:34 PM  PATIENT:  [name] [name]  53 y.o. 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

## 2019-05-19 NOTE — Anesthesia Procedure Notes (Signed)
Procedure Name: Intubation Date/Time: 05/19/2019 2:08 PM Performed by: Griffin Dakin, CRNA Pre-anesthesia Checklist: Patient identified, Emergency Drugs available, Suction available and Patient being monitored Patient Re-evaluated:Patient Re-evaluated prior to induction Oxygen Delivery Method: Circle system utilized Preoxygenation: Pre-oxygenation with 100% oxygen Induction Type: IV induction Ventilation: Mask ventilation without difficulty Laryngoscope Size: Mac and 3 Tube type: Oral Tube size: 7.0 mm Number of attempts: 1 Airway Equipment and Method: Stylet and Oral airway Placement Confirmation: ETT inserted through vocal cords under direct vision,  positive ETCO2 and breath sounds checked- equal and bilateral Tube secured with: Tape Dental Injury: Teeth and Oropharynx as per pre-operative assessment

## 2019-05-19 NOTE — Anesthesia Procedure Notes (Signed)
Anesthesia Regional Block: TAP block   Pre-Anesthetic Checklist: ,, timeout performed, Correct Patient, Correct Site, Correct Laterality, Correct Procedure, Correct Position, site marked, Risks and benefits discussed,  Surgical consent,  Pre-op evaluation,  At surgeon's request and post-op pain management  Laterality: Left  Prep: chloraprep       Needles:  Injection technique: Single-shot  Needle Type: Echogenic Stimulator Needle     Needle Length: 10cm  Needle Gauge: 21     Additional Needles:   Procedures:,,,, ultrasound used (permanent image in chart),,,,  Narrative:  Start time: 05/19/2019 1:05 PM End time: 05/19/2019 1:09 PM Injection made incrementally with aspirations every 5 mL.  Performed by: Personally  Anesthesiologist: Lidia Collum, MD  Additional Notes: Monitors applied. Injection made in 5cc increments. No resistance to injection. Good needle visualization. Patient tolerated procedure well.

## 2019-05-19 NOTE — Anesthesia Preprocedure Evaluation (Addendum)
Anesthesia Evaluation  Patient identified by MRN, date of birth, ID band Patient awake    Reviewed: Allergy & Precautions, NPO status , Patient's Chart, lab work & pertinent test results  History of Anesthesia Complications Negative for: history of anesthetic complications  Airway Mallampati: II  TM Distance: >3 FB Neck ROM: Full    Dental  (+) Teeth Intact   Pulmonary neg pulmonary ROS,    Pulmonary exam normal        Cardiovascular negative cardio ROS Normal cardiovascular exam     Neuro/Psych PSYCHIATRIC DISORDERS Anxiety Depression S/p ACDF    GI/Hepatic Neg liver ROS, GERD  ,  Endo/Other  negative endocrine ROS  Renal/GU negative Renal ROS  negative genitourinary   Musculoskeletal  (+) Arthritis ,   Abdominal   Peds  Hematology negative hematology ROS (+)   Anesthesia Other Findings   Reproductive/Obstetrics                            Anesthesia Physical Anesthesia Plan  ASA: II  Anesthesia Plan: General   Post-op Pain Management:    Induction: Intravenous  PONV Risk Score and Plan: 3 and Ondansetron, Dexamethasone, Treatment may vary due to age or medical condition and Midazolam  Airway Management Planned: Oral ETT  Additional Equipment: None  Intra-op Plan:   Post-operative Plan: Extubation in OR  Informed Consent: I have reviewed the patients History and Physical, chart, labs and discussed the procedure including the risks, benefits and alternatives for the proposed anesthesia with the patient or authorized representative who has indicated his/her understanding and acceptance.     Dental advisory given  Plan Discussed with:   Anesthesia Plan Comments:        Anesthesia Quick Evaluation

## 2019-05-19 NOTE — Discharge Instructions (Signed)

## 2019-05-19 NOTE — Transfer of Care (Signed)
Immediate Anesthesia Transfer of Care Note  Patient: [name] [name]  Procedure(s) Performed: ANTERIOR LATERAL LUMBAR FUSION LUMBAR THREE-FOUR (N/A )  Patient Location: PACU  Anesthesia Type:General  Level of Consciousness: awake  Airway & Oxygen Therapy: Patient Spontanous Breathing  Post-op Assessment: Report given to RN and Post -op Vital signs reviewed and stable  Post vital signs: Reviewed and stable  Last Vitals:  Vitals Value Taken Time  BP 135/88 05/19/19 1701  Temp 36.6 C 05/19/19 1701  Pulse 90 05/19/19 1702  Resp 21 05/19/19 1702  SpO2 97 % 05/19/19 1702  Vitals shown include unvalidated device data.  Last Pain:  Vitals:   05/19/19 1052  TempSrc:   PainSc: 7       Patients Stated Pain Goal: 3 (25/63/89 [id number])  Complications: No apparent anesthesia complications

## 2019-05-19 NOTE — H&P (Signed)
Addendum H&P  Patient continues to have significant back buttock and neuropathic leg pain.  Despite appropriate conservative management her quality of life has continued to deteriorate.  Patient has adjacent segment degenerative lumbar disc disease, and has had previous L3-4 decompression.  As result of the ongoing pain and the failure to improve we have elected to move forward with an L3-4 lateral interbody fusion with lateral plate application.  All appropriate risks benefits and alternatives were again reviewed with the patient and consent was obtained.  All of the patient's questions were encouraged and addressed.

## 2019-05-19 NOTE — Op Note (Signed)
Operative report  Preoperative diagnosis: Adjacent segment degenerative disc disease with discogenic back pain L3-4.  Previous posterior decompression L3-4 and posterior fusion L4-S1.  Postoperative diagnosis: Same  Operative procedure: Lateral interbody fusion L3-4.   2.  Application of independent lateral plate for fusion L3-4.  Implants: NuVasive titanium intervertebral cage: 12 x 22 x 50.  10 degree lordosis.  12 mm of lateral plate affixed with 5.5 x 50 mm screw into L3, and 5.5 x 55 mm screw into L4.  Allograft:  Allosync 15 cc  First assistant: Glynis Smiles, PA  Complications: None  Indications: [name] [name] is a very pleasant 53 year old woman has had a previous L4-S1 fusion with instrumentation and intervertebral cages.  Over the last year or 2 has had progressive debilitating back pain which is significantly affected her quality of life.  She is also now noted increasing pain radiating into the lower extremities.  Despite appropriate conservative management she failed to improve.  Ultimately an L3-4 intradiscal Marcaine injection temporarily significantly improved her pain.  As result of the confirmatory test we elected to move forward with surgery.  All appropriate risks benefits and alternatives to surgery were discussed with the patient and consent was obtained  Operative report: Patient is brought the operating room placed upon the operating table.  After successful induction of general anesthesia endotracheal ovation teds SCDs and a Foley were inserted.  The neuro monitoring representative then applied all appropriate needles and pads and the patient was turned into the left lateral decubitus position (left side up) axillary roll was positioned as was pads at the knee and ankle.  Pillows were placed in between the legs and the left arm was placed into the independent arm holder.  All bony prominences well-padded.  The patient was then secured directly to the table with a significant  amount of tape to prevent movement.  I then confirmed satisfactory positioning of the spine using fluoroscopy.  In the AP and lateral planes the L3-4 level was perfectly centered.  At this point the abdomen and posterior spine were prepped and draped in a standard fashion.  Timeout was taken to confirm patient procedure and all other important data.  Stab incision was made with a Jamshidi needle and the Jamshidi needle was advanced down to the tip of the pelvis.  It was advanced into the pelvis and I aspirated about 20 cc of marrow blood to mixed with the allograft.  I then marked out the anterior and posterior margins of the L3-4 disc space and infiltrated the incision site with quarter percent Marcaine with epinephrine.  Transverse incision was made and sharp dissection was carried out down to the fascia of the external oblique.  I made a small second incision 1 fingerbreadth posteriorly and bluntly dissected down to the retroperitoneal fascia I advanced into the retroperitoneal fascia and then began bluntly dissecting until ultimately is able to dissect through the undersurface of the oblique muscle so I could visualize my finger in the initial incision.  The first dilating tube was then placed on my fingertip and then advanced it down to the surface of the psoas.  I confirmed I was in the midportion of the L3-4 disc space before stimulating on the surface of the psoas.  Confirming that I was not traumatizing the plexus I advanced through the psoas down to the lateral aspect of the disc space.  A cannulated guidepin was placed and I circumferentially stimulated to confirm satisfactory position of my initial dilator.  I then  sequentially dilated stimulating with each sequential dilatation.  Again there was no adverse neuro monitoring activity.  Once I had the final dilator in place I placed the tractor and remove the relating tubes.  I confirmed satisfactory position in the AP and lateral planes.  I then gently  repositioned my retractor moving it posteriorly.  I confirmed an excellent cuff of muscle working posteriorly until I was in the posterior third of the disc space.  I then directly stimulated behind each of the blades to confirm that the plexus was not under compression or tension.  Once this was confirmed I then secured the posterior blade with my trocar into the disc space.  I now had excellent visualization of the lateral surface of the L3-4 disc space.  An annulotomy was performed with a 10 blade scalpel.  An osteotome was then used to pass along the endplates of L3 and L4 and release the contralateral annulus.  Once this was done I used a box osteotomes to remove the bulk of the disc material.  Following this using curettes I removed all of the remaining disc and cartilaginous endplate so I had bleeding subchondral bone.  I then began using my trial inserters.  Ultimately the size 12 lordotic by 22 mm cage provided the best overall fit.  This cage was obtained and packed with the allograft.  I then malleted it to the appropriate depth.  Cage itself was well-seated and was firmly positioned and was not loose.  I remove the inserting device and took x-rays to confirm satisfactory position.  Once this was confirmed I then gently repositioned my retractors in order to accommodate to place the plate.  The 12 mm plate was then obtained and placed onto the lateral surface of the L3 and L4 vertebral body I confirmed satisfactory position in both planes using fluoroscopy.  I then used a temporary set pin to secure the plate into the body of L3.  I then advanced the trocar through the L4 vertebral body.  I confirmed that I was not contacting the existing pedicle screws from her prior fusion.  Once I was able to get bicortical purchase I removed it and then placed a 55 mm 5.5 diameter screw.  The screw was partially placed and then I prepared the L3 hole.  A 50 mm length screw provided the best fit.  Both screws were  advanced and had excellent purchase both screws obtain bicortical purchase and were firmly seated.  At this point I irrigated the wound copiously with normal saline and remove the trocar and assessed the wound.  Using bipolar cautery and FloSeal I obtained and maintain hemostasis.  I then remove the retracting device and took my final images.  Both the AP and lateral fluoroscopy views demonstrated satisfactory position of the intervertebral cage and the lateral plate and screws.  At this point I closed the deep fascia of the external oblique with interrupted #1 Vicryl sutures.  I then used a 0 Vicryl runner and then 2-0 Vicryl sutures for a layered closure.  The skin was closed with a 3-0 Monocryl.  Steri-Strips and a dry dressing were applied.  I also irrigated and closed in a layered fashion the smaller secondary posterior lateral incision.  The end of the case all needle and sponge counts were correct.  The patient was ultimately extubated and transferred to the PACU without incident.

## 2019-05-20 ENCOUNTER — Encounter (HOSPITAL_COMMUNITY): Payer: Self-pay | Admitting: Orthopedic Surgery

## 2019-05-20 NOTE — Progress Notes (Signed)
Subjective: 1 Day Post-Op Procedure(s) (LRB): ANTERIOR LATERAL LUMBAR FUSION LUMBAR THREE-FOUR (N/A) Patient reports pain as moderate.  Nerve pain appears to be resolved. Patient complaining of thigh/groin pain which is to be expected given retraction.  Tolerating PO without n/v +ambulation +void +flatus Denies CP, calf pain, SOB, HA/dizziness, sweats/chills  Objective: Vital signs in last 24 hours: Temp:  [97.8 F (36.6 C)-98.4 F (36.9 C)] 98.2 F (36.8 C) (10/23 0723) Pulse Rate:  [65-99] 72 (10/23 0723) Resp:  [10-19] 16 (10/23 0723) BP: (101-135)/(62-88) 106/68 (10/23 0723) SpO2:  [94 %-100 %] 94 % (10/23 0723)  Intake/Output from previous day: 10/22 0701 - 10/23 0700 In: 1100 [I.V.:1100] Out: 100 [Blood:100] Intake/Output this shift: Total I/O In: 360 [P.O.:360] Out: -   No results for input(s): HGB in the last 72 hours. No results for input(s): WBC, RBC, HCT, PLT in the last 72 hours. No results for input(s): NA, K, CL, CO2, BUN, CREATININE, GLUCOSE, CALCIUM in the last 72 hours. No results for input(s): LABPT, INR in the last 72 hours.  Neurologically intact ABD soft Neurovascular intact Sensation intact distally Intact pulses distally Dorsiflexion/Plantar flexion intact Incision: dressing C/D/I No cellulitis present Compartment soft   Assessment/Plan: 1 Day Post-Op Procedure(s) (LRB): ANTERIOR LATERAL LUMBAR FUSION LUMBAR THREE-FOUR (N/A) Advance diet Up with therapy DVT PPx: Teds, SCDs, ambulation Plan to discharge home today.  Yvonne Kendall Ward 05/20/2019, 10:45 AM

## 2019-05-20 NOTE — Plan of Care (Signed)
Patient alert and oriented, mae's well, voiding adequate amount of urine, swallowing without difficulty, no c/o pain at time of discharge. Patient discharged home with family. Script and discharged instructions given to patient. Patient and family stated understanding of instructions given. Patient has an appointment with Dr. Brooks  

## 2019-05-20 NOTE — Evaluation (Signed)
Occupational Therapy Evaluation and Discharge Patient Details Name: [name] [name] MRN: [id number] DOB: 06/19/1966 Today's Date: 05/20/2019    History of Present Illness Lateral interbody fusion L3-4.PHMx: removal of hardware C5-6 and ACDF C6-7 04/2015   Clinical Impression   This 53 yo female admitted and underwent above presents to acute OT with all education completed, we will D/C from acute OT.    Follow Up Recommendations  No OT follow up;Supervision - Intermittent    Equipment Recommendations  None recommended by OT       Precautions / Restrictions Precautions Precautions: Fall;Back Precaution Booklet Issued: Yes (comment) Precaution Comments: Reviewed handout and pt was cued for precautions during functional mobility Required Braces or Orthoses: Spinal Brace Spinal Brace: Lumbar corset;Applied in sitting position Restrictions Weight Bearing Restrictions: No      Mobility Bed Mobility               General bed mobility comments: Pt up standing in room upon arrival with brace on  Transfers Overall transfer level: Independent                    Balance Overall balance assessment: Independent                                         ADL either performed or assessed with clinical judgement   ADL                                         General ADL Comments: Pt educated on use use of wet wipes for back pericare, use of 2 cups for brushing teeth to avoid bending over sink, sit<>stand stance that is easier when there are not handle/arms to reach back for/push from (returned demo). Pt educated on dressing and returned demo.     Vision Patient Visual Report: No change from baseline              Pertinent Vitals/Pain Pain Assessment: 0-10 Pain Score: 5  Pain Location: incisional Pain Descriptors / Indicators: Sore;Operative site guarding Pain Intervention(s): Limited activity within patient's tolerance;Monitored  during session     Hand Dominance Left   Extremity/Trunk Assessment Upper Extremity Assessment Upper Extremity Assessment: Overall WFL for tasks assessed           Communication Communication Communication: No difficulties   Cognition Arousal/Alertness: Awake/alert Behavior During Therapy: WFL for tasks assessed/performed Overall Cognitive Status: Within Functional Limits for tasks assessed                                                Home Living Family/patient expects to be discharged to:: Private residence(Simultaneous filing. 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;Pain         OT Goals(Current goals can be found in the care plan section) Acute Rehab OT Goals Patient Stated Goal: home today  OT Frequency:                AM-PAC OT "6 Clicks" Daily Activity     Outcome Measure Help from another person eating meals?: None Help from another person taking care of personal grooming?: None Help from another person toileting, which includes using toliet, bedpan, or urinal?: None Help from another person bathing (including washing, rinsing, drying)?: None Help from another person to put on and taking off regular upper body clothing?: None Help from another person to put on and taking off regular lower body clothing?: None 6 Click Score: 24   End of  Session Equipment Utilized During Treatment: Back brace Nurse Communication: (No OT needs)  Activity Tolerance: Patient tolerated treatment well Patient left: (sitting EOB)  OT Visit Diagnosis: Pain Pain - part of body: (back)                Time: 8101-7510 OT Time Calculation (min): 22 min Charges:  OT General Charges $OT Visit: 1 Visit OT Evaluation $OT Eval Moderate Complexity: 1 Mod  [name] [name], OTR/L Acute [employer] Pager [phone] Office [phone]     [name] [name] 05/20/2019, 11:00 AM

## 2019-05-20 NOTE — Evaluation (Signed)
Physical Therapy Evaluation and Discharge Patient Details Name: [name] [name] MRN: [id number] DOB: [birth year] Today's Date: 05/20/2019   History of Present Illness  Pt is a 53 y/o female who presents s/p L3-L4 ALIF on 05/19/2019.   Clinical Impression  Patient evaluated by Physical Therapy with no further acute PT needs identified. All education has been completed and the patient has no further questions. Pt was able to demonstrate transfers and ambulation with gross modified independence and no AD. Pt was educated on precautions, brace application/wearing schedule, appropriate activity progression, and car transfer. See below for any follow-up Physical Therapy or equipment needs. PT is signing off. Thank you for this referral.     Follow Up Recommendations No PT follow up;Supervision - Intermittent    Equipment Recommendations  None recommended by PT    Recommendations for Other Services       Precautions / Restrictions Precautions Precautions: Fall;Back Precaution Booklet Issued: Yes (comment) Precaution Comments: Reviewed handout and pt was cued for precautions during functional mobility Required Braces or Orthoses: Spinal Brace Spinal Brace: Lumbar corset;Applied in sitting position Restrictions Weight Bearing Restrictions: No      Mobility  Bed Mobility Overal bed mobility: Modified Independent             General bed mobility comments: HOB flat and rails lowered to simulate home environment. No assist required.   Transfers Overall transfer level: Modified independent Equipment used: None             General transfer comment: Pt demonstrated proper hand placement on seated surface for safety.   Ambulation/Gait Ambulation/Gait assistance: Modified independent (Device/Increase time) Gait Distance (Feet): 400 Feet Assistive device: None   Gait velocity: Decreased Gait velocity interpretation: <1.8 ft/sec, indicate of risk for recurrent falls General  Gait Details: Pt ambulating slow but generally steady without AD. No unsteadiness or LOB noted.   Stairs            Wheelchair Mobility    Modified Rankin (Stroke Patients Only)       Balance Overall balance assessment: Independent                                           Pertinent Vitals/Pain Pain Assessment: Faces Pain Score: 5  Faces Pain Scale: Hurts little more Pain Location: incisional Pain Descriptors / Indicators: Sore;Operative site guarding Pain Intervention(s): Limited activity within patient's tolerance;Monitored during session;Repositioned    Home Living Family/patient expects to be discharged to:: Private residence Living Arrangements: Spouse/significant other Available Help at Discharge: Family;Available 24 hours/day(First few days ) Type of Home: House Home Access: Stairs to enter Entrance Stairs-Rails: Left Entrance Stairs-Number of Steps: 1-2 Home Layout: One level Home Equipment: Shower seat - built in;Grab bars - tub/shower;Hand held shower head      Prior Function Level of Independence: Independent         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    Extremity/Trunk Assessment   Upper Extremity Assessment Upper Extremity Assessment: Overall WFL for tasks assessed    Lower Extremity Assessment Lower Extremity Assessment: Generalized weakness(Consistent with pre-op diagnosis)    Cervical / Trunk Assessment Cervical / Trunk Assessment: Other exceptions Cervical / Trunk Exceptions: s/p surgery  Communication   Communication: No difficulties  Cognition Arousal/Alertness: Awake/alert Behavior During Therapy: WFL for tasks assessed/performed Overall Cognitive Status:  Within Functional Limits for tasks assessed                                        General Comments      Exercises     Assessment/Plan    PT Assessment Patent does not need any further PT  services  PT Problem List Decreased strength;Decreased activity tolerance;Decreased balance;Decreased mobility;Decreased knowledge of use of DME;Decreased safety awareness;Decreased knowledge of precautions;Pain       PT Treatment Interventions      PT Goals (Current goals can be found in the Care Plan section)  Acute Rehab PT Goals Patient Stated Goal: home today PT Goal Formulation: All assessment and education complete, DC therapy    Frequency     Barriers to discharge        Co-evaluation               AM-PAC PT "6 Clicks" Mobility  Outcome Measure Help needed turning from your back to your side while in a flat bed without using bedrails?: None Help needed moving from lying on your back to sitting on the side of a flat bed without using bedrails?: None Help needed moving to and from a bed to a chair (including a wheelchair)?: None Help needed standing up from a chair using your arms (e.g., wheelchair or bedside chair)?: None Help needed to walk in hospital room?: None Help needed climbing 3-5 steps with a railing? : None 6 Click Score: 24    End of Session Equipment Utilized During Treatment: Gait belt Activity Tolerance: Patient tolerated treatment well Patient left: in chair;with call bell/phone within reach Nurse Communication: Mobility status PT Visit Diagnosis: Unsteadiness on feet (R26.81);Pain Pain - part of body: (back)    Time: 7628-3151 PT Time Calculation (min) (ACUTE ONLY): 25 min   Charges:   PT Evaluation $PT Eval Moderate Complexity: 1 Mod PT Treatments $Gait Training: 8-22 mins        Rolinda Roan, PT, DPT Acute Rehabilitation Services Pager: [phone] Office: [phone]   Thelma Comp 05/20/2019, 2:18 PM

## 2019-05-20 NOTE — Anesthesia Postprocedure Evaluation (Signed)
Anesthesia Post Note  Patient: [name]  Procedure(s) Performed: ANTERIOR LATERAL LUMBAR FUSION LUMBAR THREE-FOUR (N/A )     Patient location during evaluation: PACU Anesthesia Type: General Level of consciousness: awake and alert Pain management: pain level controlled Vital Signs Assessment: post-procedure vital signs reviewed and stable Respiratory status: spontaneous breathing, nonlabored ventilation, respiratory function stable and patient connected to nasal cannula oxygen Cardiovascular status: blood pressure returned to baseline and stable Postop Assessment: no apparent nausea or vomiting Anesthetic complications: no    Last Vitals:  Vitals:   05/20/19 0433 05/20/19 0723  BP: 120/68 106/68  Pulse: 82 72  Resp: 18 16  Temp: 36.8 C 36.8 C  SpO2: 98% 94%    Last Pain:  Vitals:   05/20/19 0923  TempSrc:   PainSc: 4                  Tiajuana Amass

## 2019-05-23 NOTE — Discharge Summary (Signed)
Patient ID: [name] [name] MRN: [id number] DOB/AGE: 10-25-1965 53 y.o.  Admit date: 05/19/2019 Discharge date: 05/23/2019  Admission Diagnoses:  Active Problems:   Fusion of lumbosacral spine   Discharge Diagnoses:  Active Problems:   Fusion of lumbosacral spine  status post Procedure(s): ANTERIOR LATERAL LUMBAR FUSION LUMBAR THREE-FOUR  Past Medical History:  Diagnosis Date   Anemia    Chronic neck pain    and arm pain   Complication of anesthesia    difficulty breathing with Epidural during c -seaction   DDD (degenerative disc disease), cervical    Depression    History of abnormal cervical Pap smear    hx chronic --  s/p vag. hysterectomy 1996   Pelvic pain in female    Wears contact lenses     Surgeries: Procedure(s): ANTERIOR LATERAL LUMBAR FUSION LUMBAR THREE-FOUR on 05/19/2019   Consultants:   Discharged Condition: Improved  Hospital Course: [name] [name] is an 53 y.o. female who was admitted 05/19/2019 for operative treatment of Adjacent segment disease LUMBAR THREE-FOUR with degenerative disc disease. Patient failed conservative treatments (please see the history and physical for the specifics) and had severe unremitting pain that affects sleep, daily activities and work/hobbies. After pre-op clearance, the patient was taken to the operating room on 05/19/2019 and underwent  Procedure(s): ANTERIOR LATERAL LUMBAR FUSION LUMBAR THREE-FOUR.    Patient was given perioperative antibiotics:  Anti-infectives (From admission, onward)   Start     Dose/Rate Route Frequency Ordered Stop   05/19/19 2200  ceFAZolin (ANCEF) IVPB 1 g/50 mL premix     1 g 100 mL/hr over 30 Minutes Intravenous Every 8 hours 05/19/19 1812 05/20/19 0446   05/19/19 1028  ceFAZolin (ANCEF) IVPB 2g/100 mL premix     2 g 200 mL/hr over 30 Minutes Intravenous 30 min pre-op 05/19/19 1028 05/19/19 1415       Patient was given sequential compression devices and early ambulation to  prevent DVT.   Patient benefited maximally from hospital stay and there were no complications. At the time of discharge, the patient was urinating/moving their bowels without difficulty, tolerating a regular diet, pain is controlled with oral pain medications and they have been cleared by PT/OT.   Recent vital signs: No data found.   Recent laboratory studies: No results for input(s): WBC, HGB, HCT, PLT, NA, K, CL, CO2, BUN, CREATININE, GLUCOSE, INR, CALCIUM in the last 72 hours.  Invalid input(s): PT, 2   Discharge Medications:   Allergies as of 05/20/2019      Reactions   Codeine Itching   Contrast Media [iodinated Diagnostic Agents] Itching      Medication List    TAKE these medications   albuterol 108 (90 Base) MCG/ACT inhaler Commonly known as: VENTOLIN HFA Inhale 2 puffs into the lungs every 6 (six) hours as needed for wheezing.   ALPRAZolam 0.25 MG tablet Commonly known as: XANAX Take 0.25 mg by mouth at bedtime as needed for anxiety or sleep.   methocarbamol 500 MG tablet Commonly known as: Robaxin Take 1 tablet (500 mg total) by mouth every 8 (eight) hours as needed for up to 5 days for muscle spasms.   ondansetron 4 MG tablet Commonly known as: Zofran Take 1 tablet (4 mg total) by mouth every 8 (eight) hours as needed for nausea or vomiting.   oxyCODONE-acetaminophen 10-325 MG tablet Commonly known as: Percocet Take 1 tablet by mouth every 6 (six) hours as needed for up to 5 days for pain.  venlafaxine XR 75 MG 24 hr capsule Commonly known as: EFFEXOR-XR Take 75 mg by mouth daily with breakfast.       Diagnostic Studies: Dg Chest 2 View  Result Date: 05/16/2019 CLINICAL DATA:  Pre-op Lumbar surgery EXAM: CHEST - 2 VIEW COMPARISON:  None. FINDINGS: The heart size and mediastinal contours are within normal limits. The lungs are clear. No pneumothorax or pleural effusion. No acute finding in the visualized skeleton. IMPRESSION: No active cardiopulmonary  disease. Electronically Signed   By: Emmaline Kluver M.D.   On: 05/16/2019 14:03   Dg Lumbar Spine 2-3 Views  Result Date: 05/19/2019 CLINICAL DATA:  Lumbar fixation EXAM: LUMBAR SPINE - 2-3 VIEW; DG C-ARM 1-60 MIN COMPARISON:  None. FLUOROSCOPY TIME:  Radiation Exposure Index (as provided by the fluoroscopic device): Not available If the device does not provide the exposure index: Fluoroscopy Time:  1 minutes 27 seconds Number of Acquired Images:  2 FINDINGS: Interbody fusion at L3-4 with left lateral fixation is noted. Changes of prior fusion at L4-5 and L5-S1 are seen. IMPRESSION: Interval fusion at L3-4. Electronically Signed   By: Alcide Clever M.D.   On: 05/19/2019 16:30   Dg C-arm 1-60 Min  Result Date: 05/19/2019 CLINICAL DATA:  Lumbar fixation EXAM: LUMBAR SPINE - 2-3 VIEW; DG C-ARM 1-60 MIN COMPARISON:  None. FLUOROSCOPY TIME:  Radiation Exposure Index (as provided by the fluoroscopic device): Not available If the device does not provide the exposure index: Fluoroscopy Time:  1 minutes 27 seconds Number of Acquired Images:  2 FINDINGS: Interbody fusion at L3-4 with left lateral fixation is noted. Changes of prior fusion at L4-5 and L5-S1 are seen. IMPRESSION: Interval fusion at L3-4. Electronically Signed   By: Alcide Clever M.D.   On: 05/19/2019 16:30    Discharge Instructions    Incentive spirometry RT   Complete by: As directed       Follow-up Information    Venita Lick, MD. Schedule an appointment as soon as possible for a visit in 2 weeks.   Specialty: Orthopedic Surgery Why: For suture removal, If symptoms worsen, For wound re-check Contact information: [address] [place] [address] [phone]           Discharge Plan:  discharge to home  Disposition: stable    Signed: Leonette Monarch Twana Wileman for [facility] PA-C [facility] [phone] 05/23/2019, 8:19 AM

## 2020-05-04 ENCOUNTER — Other Ambulatory Visit: Payer: Self-pay | Admitting: Orthopedic Surgery

## 2020-05-04 DIAGNOSIS — M259 Joint disorder, unspecified: Secondary | ICD-10-CM

## 2020-05-21 ENCOUNTER — Ambulatory Visit
Admission: RE | Admit: 2020-05-21 | Discharge: 2020-05-21 | Disposition: A | Discharge status: Home / Self Care | Payer: 59 | Source: Ambulatory Visit | Attending: Orthopedic Surgery | Admitting: Orthopedic Surgery

## 2020-05-21 ENCOUNTER — Other Ambulatory Visit: Payer: Self-pay

## 2020-05-21 DIAGNOSIS — M259 Joint disorder, unspecified: Secondary | ICD-10-CM

## 2020-11-26 ENCOUNTER — Other Ambulatory Visit: Payer: Self-pay | Admitting: Orthopedic Surgery

## 2020-12-10 ENCOUNTER — Other Ambulatory Visit: Payer: Self-pay | Admitting: Orthopedic Surgery

## 2020-12-10 DIAGNOSIS — M259 Joint disorder, unspecified: Secondary | ICD-10-CM

## 2020-12-14 ENCOUNTER — Telehealth: Payer: Self-pay

## 2020-12-14 MED ORDER — DIPHENHYDRAMINE HCL 50 MG PO TABS: 50.0000 mg | ORAL_TABLET | Freq: Once | ORAL | 0 refills | Status: AC

## 2020-12-14 MED ORDER — DIPHENHYDRAMINE HCL 50 MG PO CAPS: 50.0000 mg | ORAL_CAPSULE | Freq: Once | ORAL | Status: AC

## 2020-12-14 MED ORDER — PREDNISONE 50 MG PO TABS: ORAL_TABLET | ORAL | 0 refills | Status: DC

## 2020-12-14 NOTE — Telephone Encounter (Signed)
Phone call to patient to review instructions for 13 hr prep for Injection w/ CT contrast on 12/28/20  at 11:00 am. Prescription called into [facility]. Pt aware and verbalized understanding of instructions. Prescription: Pt to take 50 mg of prednisone on 12/27/20 at 10:00 PM, 50 mg of prednisone on 12/28/20 at 4:00 AM, and 50 mg of prednisone on 12/28/20 at 10:00 AM. Pt is also to take 50 mg of benadryl on 12/28/20 at 10:00 AM. Please call [phone] with any questions.   Prednisone and benadryl were both send in as prescriptions.

## 2020-12-28 ENCOUNTER — Other Ambulatory Visit: Payer: Self-pay

## 2020-12-28 ENCOUNTER — Ambulatory Visit
Admission: RE | Admit: 2020-12-28 | Discharge: 2020-12-28 | Disposition: A | Discharge status: Home / Self Care | Payer: 59 | Source: Ambulatory Visit | Attending: Orthopedic Surgery | Admitting: Orthopedic Surgery

## 2020-12-28 DIAGNOSIS — M259 Joint disorder, unspecified: Secondary | ICD-10-CM

## 2020-12-28 MED ORDER — METHYLPREDNISOLONE ACETATE 40 MG/ML INJ SUSP (RADIOLOG: 120.0000 mg | Freq: Once | INTRAMUSCULAR | Status: DC

## 2021-02-27 IMAGING — XA DG DISKOGRAPHY LUMBAR S+I
3 series · 3 of 3 positions shown · non-contrast
Comparison: none

CLINICAL DATA: Remote history of L4-5 and L5-S1 lumbar fusion.
Right lower extremity pain. Disc bulging at the adjacent level,
L3-4.

[Series 1: ortho adipose · 1 of 1 slices shown (1 of 3)]
[im 1/1]
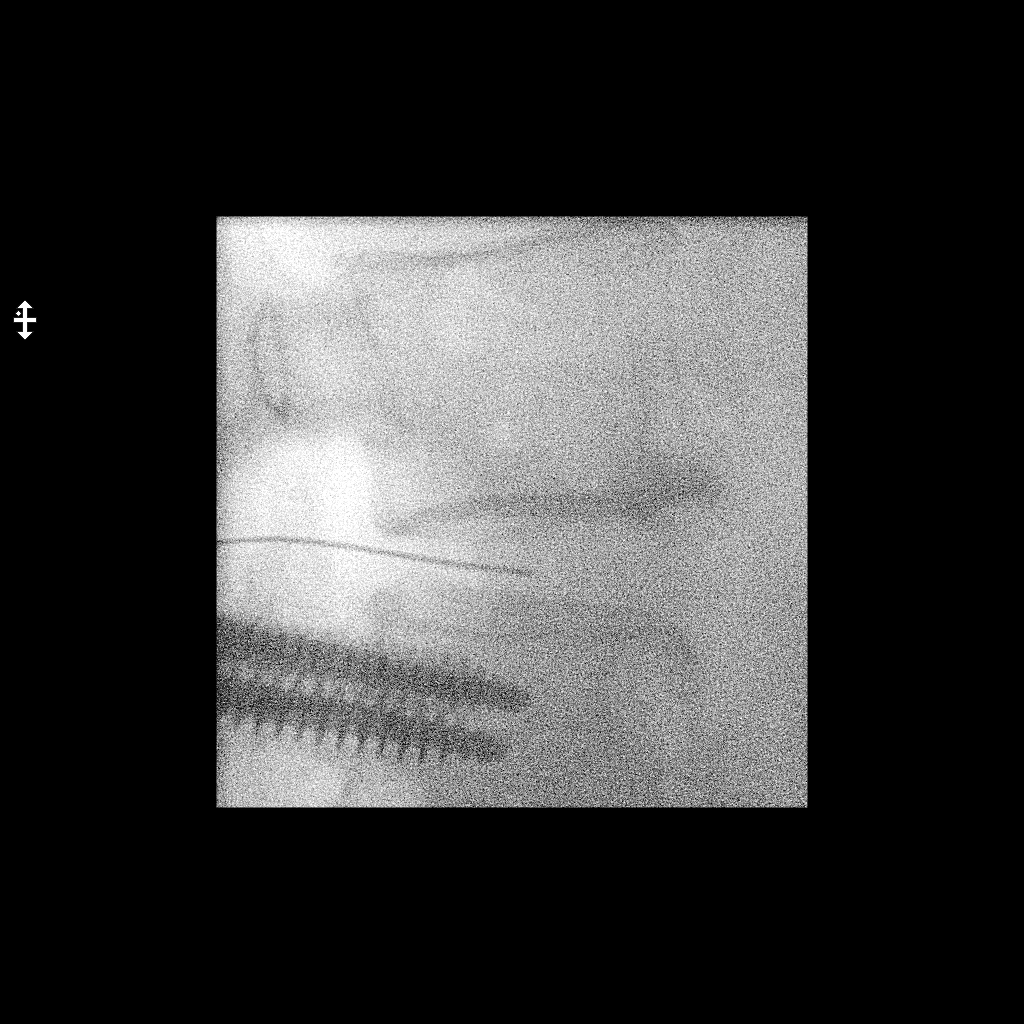

[Series 2: ortho adipose · 1 of 1 slices shown (2 of 3)]
[im 1/1]
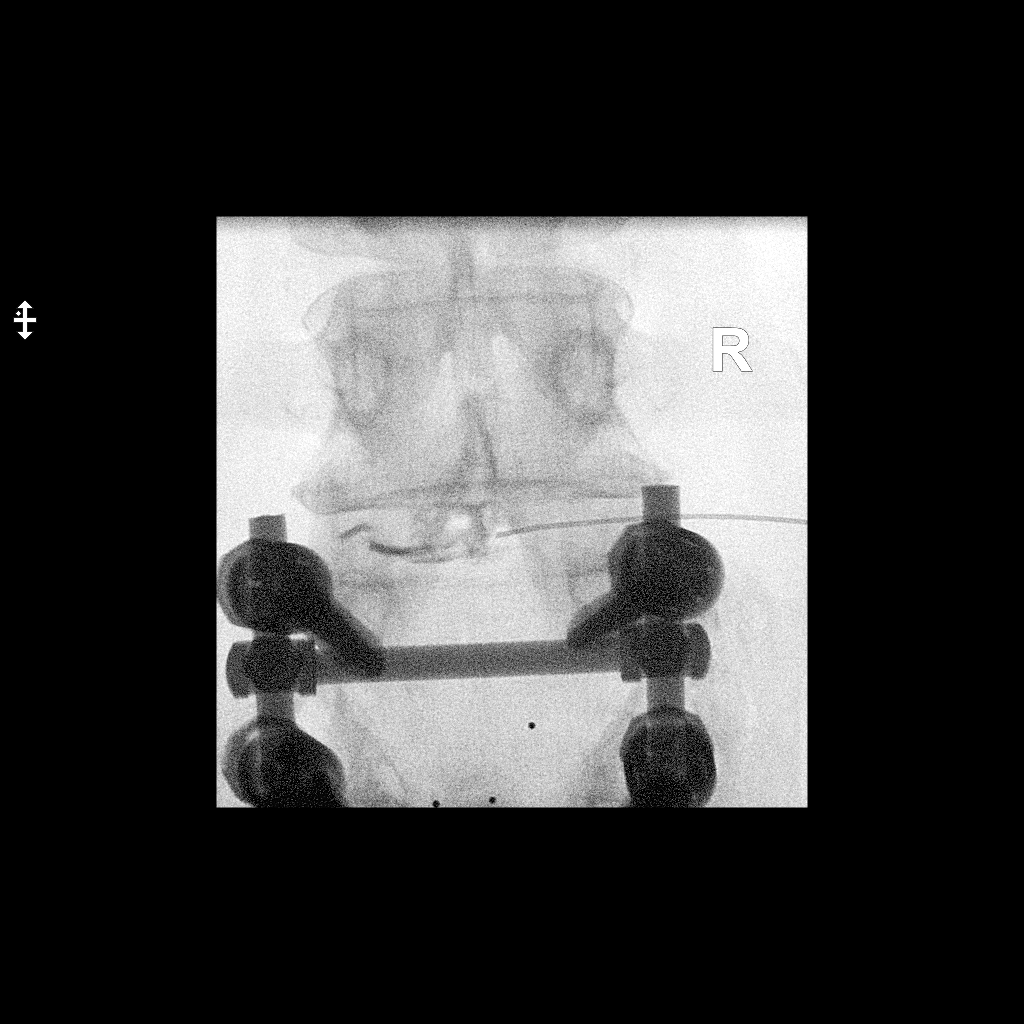

[Series 3: ortho adipose · 1 of 1 slices shown (3 of 3)]
[im 1/1]
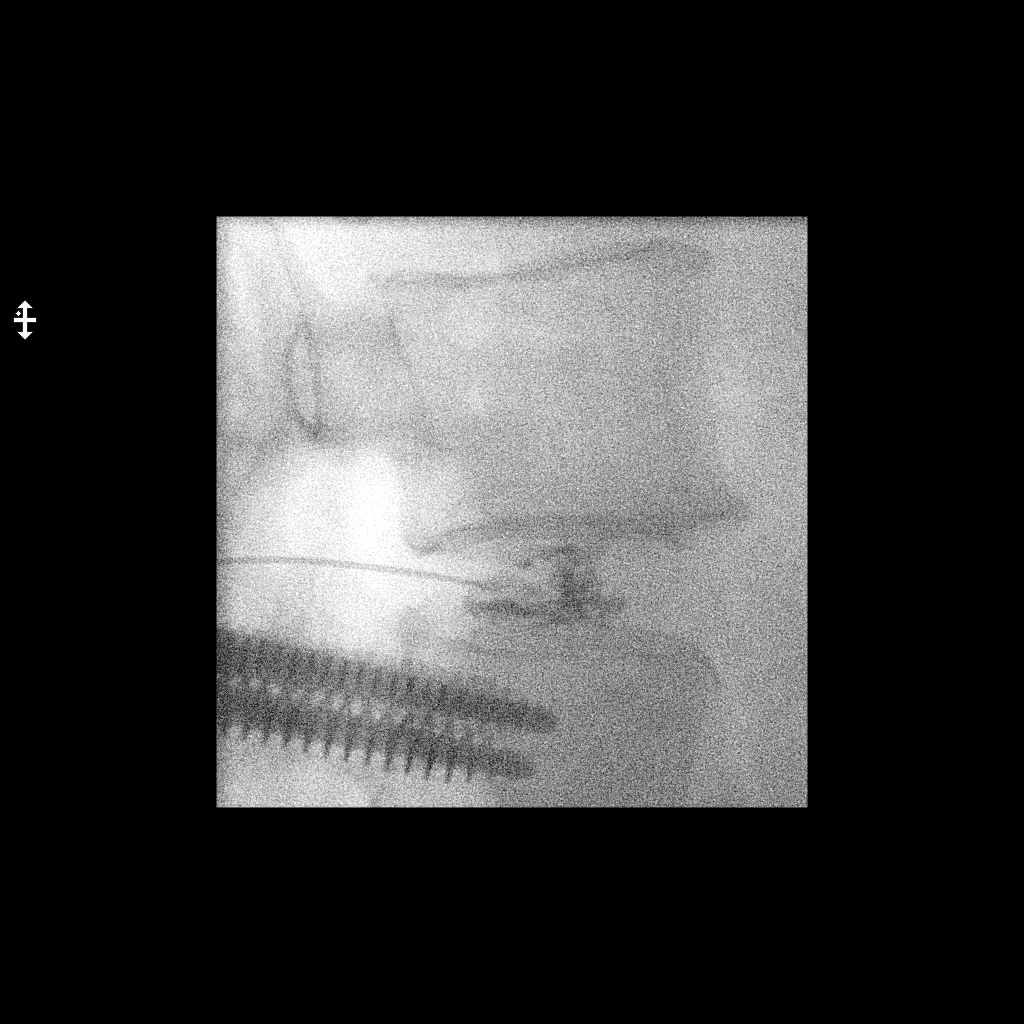

[3 of 3 positions shown; findings below may reference images not displayed]

EXAM:
DIAGNOSTIC LUMBAR DISK INJECTIONS

FLUOROSCOPY TIME:  Radiation Exposure Index (as provided by the
fluoroscopic device): 172.74 uGy*m2

CONTRAST:  1.0 mL Isovue M 300

PROCEDURE:
The procedure was discussed in depth with the patient including the
potential risk of infection. The patient received 1 gram of Ancef
intravenously prior to the procedure with a small amount withdrawn
and added to the contrast for injection.

The patient was placed prone on the fluoroscopic table. A Betadine
scrub of the low back was performed and the patient was draped in a
sterile fashion.

Skin anesthesia was carried [DATE]% Lidocaine. 15 cm 22 gauge
Chiba needle directed into the nuclear regions of the disc via a
left posterolateral approach. 1.0 mL Isovue M 300 mixed with Ancef
was injected spot radiographs were taken.

I then injected 2.0 mL 0.5% bupivacaine.
FINDINGS: Images demonstrate a rightward annular tear from the nuclear
injection. Patient experienced left lower extremity pain, likely
secondary to irritation of the left L3 nerve root with needle
placement. She had experienced some right-sided pain towards the end
of the injection which was similar to her usual pain.

Following injection, patient was able to walk across the room
without experiencing her typical low back or right lower extremity
pain.
IMPRESSION: 1. Technically successful intradiscal injection of 0.5% bupivacaine
at the L3-4 level via a left posterolateral approach.
2. Typical right lower extremity radicular symptoms were replicated
with the injection.
3. The patient experienced immediate relief of pain with walking
after the injection. She will record her symptoms over the remainder
of the day.

## 2023-12-09 ENCOUNTER — Telehealth: Payer: Self-pay

## 2023-12-09 ENCOUNTER — Encounter: Payer: Self-pay | Admitting: Internal Medicine

## 2023-12-09 ENCOUNTER — Ambulatory Visit (INDEPENDENT_AMBULATORY_CARE_PROVIDER_SITE_OTHER): Payer: Self-pay | Admitting: Internal Medicine

## 2023-12-09 VITALS — BP 122/82 | HR 89 | Temp 98.7°F | Resp 20 | Ht 66.0 in | Wt 217.0 lb

## 2023-12-09 DIAGNOSIS — L508 Other urticaria: Secondary | ICD-10-CM | POA: Diagnosis not present

## 2023-12-09 NOTE — Telephone Encounter (Signed)
 Called and left voicemail for [name] [name] to return call to office with Rheumatology and Dermatology office information.

## 2023-12-09 NOTE — Patient Instructions (Addendum)
 Chronic urticaria Persistent hives lasting up to a week at a time,atypical for chronic urticaria. Malar rash in one picture. Has seen Rheumatology and Dermatology with unrevealing biopsies and labs. Allergies unlikely. Considered bug bites due to duration. Will treat with high dose antihistamines to monitor response. Labs for chronic urticaria (autoimmune forms, thyroid disease, alpha gal) - Increase Allegra to two tablets daily, if ineffective, increase to four tablets daily (2 in AM and 2 in PM) - Order lab work for thyroid disease and autoimmune hives. - Obtain biopsy reports and rheumatology lab results. - Schedule follow-up in four weeks to assess treatment response and review lab results.

## 2023-12-09 NOTE — Progress Notes (Signed)
 NEW PATIENT Date of Service/Encounter:  12/09/23 Referring provider: none-self referred Primary care provider: Golden Late, MD (Inactive)  Subjective:  [name] [name] is a 58 y.o. female presenting today for evaluation of chornic urticaria. History obtained from: chart review and patient and spouse.   Discussed the use of AI scribe software for clinical note transcription with the patient, who gave verbal consent to proceed.  History of Present Illness   [name] [name] is a 58 year old female who presents with a persistent rash.  She has been experiencing a rash since January, appearing on her arms, legs, back, face, and hands. The rash typically lasts in one spot for about a week before moving to another location. It is itchy, sometimes painful to touch, and occasionally leaves bruising, likely due to scratching during the night.  Biopsies at [facility] did not conclude lupus or an environmental allergy. A rheumatologist evaluation also found no evidence of lupus. Despite trying various treatments, including creams, Benadryl , and steroid shots, she has had limited success. Allegra is taken once daily without effectiveness. She has also tried using frankincense, which helped alleviate some symptoms.  The rash occurs approximately once a week, with only one week without an episode since January. It appears in different locations each time, often on her joints such as elbows, knees, and ankles. She experiences joint pain and swelling, only when the rash is present over the joints.  Her current medications include Effexor , which she has been taking for several years, and vitamin D. She has a history of stress, particularly following the death of her daughter in January 10, 2016, which she believes may have contributed to her symptoms. 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 No consistent  triggers for the rash have been identified.   Her husband does not have the rash. She does show picutres of the rash which are consistent with hives. In one of the pictures she has a malar rash.  We do not have records from her dermatologist or rheumatologist at time of visit.       Chart Review:  Reviewed PCP notes from  09/09/23: seen for rash, suspected insect bites. Using OTC HCT with relief.   Past Medical History: Past Medical History:  Diagnosis Date   Anemia    Chronic neck pain    and arm pain   Complication of anesthesia    difficulty breathing with Epidural during c -seaction   DDD (degenerative disc disease), cervical    Depression    History of abnormal cervical Pap smear    hx chronic --  s/p vag. hysterectomy 1996   Pelvic pain in female    Wears contact lenses    Medication List:  Current Outpatient Medications  Medication Sig Dispense Refill   clobetasol cream (TEMOVATE) 0.05 % Apply topically.     diphenhydrAMINE  (BENADRYL ) 50 MG tablet Take 1 tablet (50 mg total) by mouth once for 1 dose. Pt to take 50 mg of Benadryl  on 12/28/20 @ 10:00 AM. Please call [phone] with any questions. 1 tablet 0   triamcinolone cream (KENALOG) 0.1 % Apply topically 2 (two) times daily as needed.     venlafaxine  XR (EFFEXOR -XR) 150 MG 24 hr capsule Take 1 capsule by mouth daily.     pantoprazole (PROTONIX) 40 MG tablet pantoprazole 40 mg tablet,delayed release  TAKE ONE TABLET TWICE DAILY  Venlafaxine  HCl (EFFEXOR  PO)      venlafaxine  XR (EFFEXOR -XR) 150 MG 24 hr capsule Take 150 mg by mouth daily.     venlafaxine  XR (EFFEXOR -XR) 75 MG 24 hr capsule Take 75 mg by mouth daily with breakfast. (Patient not taking: Reported on 12/09/2023)     Current Facility-Administered Medications  Medication Dose Route Frequency Provider Last Rate Last Admin   diphenhydrAMINE  (BENADRYL ) capsule 50 mg  50 mg Oral Once [name] [name], William T, MD       Known Allergies:  Allergies  Allergen  Reactions   Codeine Itching   Contrast Media [Iodinated Contrast Media] Hives and Itching   Past Surgical History: Past Surgical History:  Procedure Laterality Date   ANTERIOR CERVICAL DECOMP/DISCECTOMY FUSION  2002   C5 -- 6   ANTERIOR CERVICAL DECOMP/DISCECTOMY FUSION N/A 05/24/2015   Procedure: REMOVAL OF CERVICAL HARDWARE C5-6 ACDF C6-7;  Surgeon: [name] Ards, MD;  Location: [facility] OR;  Service: Orthopedics;  Laterality: N/A;   ANTERIOR LAT LUMBAR FUSION N/A 05/19/2019   Procedure: ANTERIOR LATERAL LUMBAR FUSION LUMBAR THREE-FOUR;  Surgeon: [name] Ards, MD;  Location: [facility] OR;  Service: Orthopedics;  Laterality: N/A;  3 hrs   CERVICAL DISC SURGERY  05/24/2015   C5 C6 C7   CESAREAN SECTION  1987   COLONOSCOPY  2007   CYSTOSCOPY N/A 03/09/2015   Procedure: CYSTOSCOPY WITH INJECTION OF VAGINAL CUFF;  Surgeon: [name] Abraham, MD;  Location: [facility] [place];  Service: Gynecology;  Laterality: N/A;   HARDWARE REMOVAL N/A 05/24/2015   Procedure: HARDWARE REMOVAL;  Surgeon: [name] Ards, MD;  Location: [facility] OR;  Service: Orthopedics;  Laterality: N/A;   LAPAROSCOPIC SALPINGO OOPHERECTOMY Bilateral 03/09/2015   Procedure: LAPAROSCOPIC SALPINGO OOPHORECTOMY;  Surgeon: [name] Abraham, MD;  Location: [facility];  Service: Gynecology;  Laterality: Bilateral;   POSTERIOR LUMBAR FUSION  1999  &  2000   L4 - 5  &  L5 - S1   TONSILLECTOMY  1991   VAGINAL HYSTERECTOMY  1996   Family History: Family History  Problem Relation Age of Onset   Colon polyps Father    Colon polyps Sister    Social History: [name] [name] lives in a house built in 1969, remodeling 2017, no water  damage, she has hardwood and laminate floors with area rugs.  Heat pump heating and central AC, indoor cat, outdoor dog which is a boxer, bed is at least 2 feet off the floor.  + Cockroaches in the home.  [place]  She is not exposed to fumes chemicals or dust.  She does use dust mite  covers on the bed and the pillows.  No smoke exposure.  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   ROS:  All other systems negative except as noted per HPI.  Objective:  Blood pressure 122/82, pulse 89, temperature 98.7 F (37.1 C), temperature source Oral, resp. rate 20, height 5\' 6"  (1.676 m), weight 217 lb (98.4 kg), SpO2 97%. Body mass index is 35.02 kg/m. Physical Exam:  General Appearance:  Alert, cooperative, no distress, appears stated age  Head:  Normocephalic, without obvious abnormality, atraumatic  Eyes:  Conjunctiva clear, EOM's intact  Nose: Nares normal,no rhinorrhea  Throat: Lips, tongue normal; teeth and gums normal, moist mucus membranes  Neck: Supple, symmetrical  Lungs:   clear to auscultation bilaterally, Respirations unlabored, no coughing  Heart:  regular rate and rhythm and no murmur, Appears well perfused  Extremities: No  edema  Skin: Erythematous raised edematous patches on forearms and forehead  Neurologic: No gross deficits   Diagnostics:  Labs:  Lab Orders         Alpha-Gal Panel         Chronic Urticaria         Sedimentation rate         Thyroid Panel With TSH         Tryptase       Assessment and Plan  Assessment and Plan Assessment & Plan  Chronic urticaria Persistent hives lasting up to a week at a time,atypical for chronic urticaria. Malar rash in one picture. Has seen Rheumatology and Dermatology with unrevealing biopsies and labs. Allergies unlikely. Considered bug bites due to duration. Will treat with high dose antihistamines to monitor response. Labs for chronic urticaria (autoimmune forms, thyroid disease, alpha gal) - Increase Allegra to two tablets daily, if ineffective, increase to four tablets daily (2 in AM and 2 in PM) - Order lab work for thyroid disease and autoimmune hives. - Obtain biopsy reports and rheumatology lab results. - Schedule follow-up in four weeks to assess treatment response  and review lab results.     This note in its entirety was forwarded to the Provider who requested this consultation.  Other: none  Thank you for your kind referral. I appreciate the opportunity to take part in Ashlynd's care. Please do not hesitate to contact me with questions.  Sincerely,  Jonathon Neighbors, MD [facility] 

## 2023-12-10 ENCOUNTER — Ambulatory Visit: Payer: Self-pay | Admitting: Internal Medicine

## 2023-12-10 NOTE — Progress Notes (Signed)
 Please let [name] [name] know that not all of her labs have returned, but her thyroid levels are abnormal and her PCP should be made aware for further evaluation of potential thyroid disease. Please also send a message to her PCP. Thanks

## 2023-12-11 LAB — ALPHA-GAL PANEL
Allergen Lamb IgE: <0.1 kU/L
Beef IgE: 0.12 kU/L — AB
IgE (Immunoglobulin E), Serum: 32 [IU]/mL (ref 6–495)
O215-IgE Alpha-Gal: 0.53 kU/L — AB
Pork IgE: <0.1 kU/L

## 2023-12-16 LAB — CHRONIC URTICARIA: cu index: 8.4 (ref <10)

## 2023-12-16 LAB — SEDIMENTATION RATE: Sed Rate: 65 mm/h — ABNORMAL HIGH (ref 0–40)

## 2023-12-16 LAB — THYROID PANEL WITH TSH
Free Thyroxine Index: 1.7 (ref 1.2–4.9)
T3 Uptake Ratio: 23 % — ABNORMAL LOW (ref 24–39)
T4, Total: 7.2 ug/dL (ref 4.5–12.0)
TSH: 5.58 u[IU]/mL — ABNORMAL HIGH (ref 0.450–4.500)

## 2023-12-16 LAB — TRYPTASE: Tryptase: 4.8 ug/L (ref 2.2–13.2)

## 2023-12-16 NOTE — Progress Notes (Signed)
 The rest of her labs are back. Her inflammatory markers are elevated-this could be due to uncontrolled thyroid  disease.  She also has a very slightly positive alpha gal but I am not sure this is clinically relevant.  Please ask her to avoid mammalian meat (beef, pork, lamb, etc) for the next 2 weeks to see if rash resolves. If not, okay to reintroduce.  Will see her in follow-up soon.  Can we look into where her dermatology and rheumatology reports are? We sent for records but I don't think we have them.

## 2023-12-17 NOTE — Telephone Encounter (Signed)
 Spoke with [name] [name] and advised of result note per Dr. Cornel Diesel patient verbalized understanding with questions about her inflammatory markers and if she should be taking medication for the inflammation and advised per Dr. Cornel Diesel she does not need to take any medication. Sent message to patient via my chart with information per her request.

## 2023-12-17 NOTE — Telephone Encounter (Signed)
 Faxed records to Dermatology and Rheumatology and sent to scan center.

## 2024-01-01 ENCOUNTER — Telehealth: Payer: Self-pay | Admitting: *Deleted

## 2024-01-01 NOTE — Telephone Encounter (Signed)
 Received faxed records from Dermatology Clerance Dais, NP- placed on Dr. Cornel Diesel' desk for review.

## 2024-01-06 ENCOUNTER — Ambulatory Visit (INDEPENDENT_AMBULATORY_CARE_PROVIDER_SITE_OTHER): Admitting: Internal Medicine

## 2024-01-06 ENCOUNTER — Encounter: Payer: Self-pay | Admitting: Internal Medicine

## 2024-01-06 VITALS — BP 112/78 | HR 98 | Temp 98.3°F | Resp 18 | Wt 217.0 lb

## 2024-01-06 DIAGNOSIS — L508 Other urticaria: Secondary | ICD-10-CM

## 2024-01-06 MED ORDER — OMALIZUMAB 150 MG/ML ~~LOC~~ SOSY
150.0000 mg | PREFILLED_SYRINGE | Freq: Once | SUBCUTANEOUS | Status: AC
  Administered 2024-01-06: 150 mg via SUBCUTANEOUS

## 2024-01-06 NOTE — Progress Notes (Signed)
 Immunotherapy   Patient Details  Name: [name] [name] MRN: [id number] Date of Birth: [birth year]  01/06/2024  [name] started Xolair injections 300 ml sample given in office. For Hives. Frequency: every 4 weeks Epi-Pen:Epi-Pen Available  Consent signed and patient instructions given.   [name] 01/06/2024, 11:24 AM

## 2024-01-06 NOTE — Patient Instructions (Addendum)
 Chronic urticaria Persistent hives lasting up to a week at a time,atypical for chronic urticaria. Malar rash in one picture. Has seen Rheumatology and Dermatology with unrevealing biopsies and labs. Allergies unlikely. Considered bug bites due to duration. Will treat with high dose antihistamines to monitor response. Labs for chronic urticaria (autoimmune forms, thyroid  disease, alpha gal) -trial of zyrtec 20 mg (2 tablets) twice daily as needed. If too drowsy, switch to claritin 20 mg twice daily (2 tablets twice daily) or xyzal 10 mg (2 tablets) twice daily - recent lab work showed hypothyroidism, now on synthroid - recent lab work also showed mildly positive alpha-gal-trial of avoidance of mammalian meat for several weeks made no difference in symptoms, okay to reintroduce - Xolair 300 mg sample given today, continue every 4 weeks.  We will submit paperwork for Xolair. You will hear from our biologics coordinator Tammy VonCannon. Please answer her phone calls to ensure a seamless approval process.    Follow up : 3 months, sooner if needed It was a pleasure seeing you again in clinic today! Thank you for allowing me to participate in your care.  Jonathon Neighbors, MD [facility]

## 2024-01-06 NOTE — Progress Notes (Signed)
 FOLLOW UP Date of Service/Encounter:   01/06/2024  Subjective:  [name] [name] (DOB: 05-18-1966) is a 58 y.o. female who [facility] in re-evaluation of the following: Chronic urticaria History obtained from: chart review and patient and husband.  For Review, LV was on 12/09/23  with Dr.Rexanna Louthan seen for intial visit for chronic urticaria. See below for summary of history and diagnostics.   Therapeutic plans/changes recommended: labs were ordered and she was diagnosed with hypothryoidism, on review of her records she has been started on levothyroxine by her primary care physician 25 mcg daily. ----------------------------------------------------- Pertinent History/Diagnostics:  Chronic urticaria Persistent hives lasting up to a week at a time,atypical for chronic urticaria. Malar rash in one picture. Has seen Rheumatology and Dermatology with unrevealing biopsies and labs. Allergies unlikely. Considered bug bites due to duration. Will treat with high dose antihistamines to monitor response. Labs for chronic urticaria (autoimmune forms, thyroid  disease, alpha gal) - Plan: Allegra to two tablets daily: 2 in AM and 2 in PM - labs: alpha gal slightly elevated 0.52 (trial of several weeks without mammalian meat did not make any difference), CU index 8.4, ESR 65 (elevated), TSH elevated and T3 low - Obtain biopsy reports and rheumatology lab results. --------------------------------------------------- Today presents for follow-up. Discussed the use of AI scribe software for clinical note transcription with the patient, who gave verbal consent to proceed.  History of Present Illness   [name] [name] is a 58 year old female with hyperthyroidism who presents with chronic hives.  She has been experiencing chronic hives since January, characterized by itchy rashes that appear symmetrically on her arms and elbows. The hives sometimes leave a shadow or bruise  after subsiding but not usually. Persistent itching affects her hands, although they are not red. She has been using liquid Benadryl  for symptom control but has not taken it for a few days. She also takes Allegra 2 tablets up to twice a day and hydroxyzine, which cause drowsiness. Despite avoiding mammalian meat due to a positive alpha-gal test, her symptoms persist.  She has a history of hyperthyroidism, diagnosed after blood work from her last encounter, and has been on levothyroxine for approximately two weeks. Since starting the medication, she feels less tired and more like herself, with improved vision clarity and reduced fatigue.  Her past medical history includes a positive ANA test and elevated liver enzymes (AST and ALT), which have been discussed with her primary care physician. She has a history of spine surgeries following a back injury in childhood, exacerbated by a sneeze that reopened fractures.  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 She enjoys staying busy to avoid feeling sad.       All medications reviewed by clinical staff and updated in chart. No new pertinent medical or surgical history except as noted in HPI.  ROS: All others negative except as noted per HPI.   Objective:  BP 112/78 (BP Location: Right Arm, Patient Position: Sitting, Cuff Size: Large)   Pulse 98   Temp 98.3 F (36.8 C) (Oral)   Resp 18   Wt 217 lb (98.4 kg)   SpO2 98%   BMI 35.02 kg/m  Body mass index is 35.02 kg/m. Physical Exam: General Appearance:  Alert, cooperative, no distress, appears stated age  Head:  Normocephalic, without obvious abnormality, atraumatic  Eyes:  Conjunctiva clear, EOM's intact  Ears EACs normal bilaterally and  normal TMs bilaterally  Nose: Nares normal, normal mucosa and no visible anterior polyps  Throat: Lips, tongue normal; teeth and gums normal, normal posterior oropharynx  Neck: Supple,  symmetrical  Lungs:   clear to auscultation bilaterally, Respirations unlabored, no coughing  Heart:  regular rate and rhythm and no murmur, Appears well perfused  Extremities: No edema  Skin: Erythematous eczematous plaque on right forearm  Neurologic: No gross deficits   Labs:  Lab Orders  No laboratory test(s) ordered today     Assessment/Plan   Chronic urticaria-not at goal Persistent hives lasting up to a week at a time,atypical for chronic urticaria. Malar rash in one picture. Has seen Rheumatology and Dermatology with unrevealing biopsies and labs. Allergies unlikely. Considered bug bites due to duration. Will treat with high dose antihistamines to monitor response. Labs for chronic urticaria (autoimmune forms, thyroid  disease, alpha gal) -trial of zyrtec 20 mg (2 tablets) twice daily as needed. If too drowsy, switch to claritin 20 mg twice daily (2 tablets twice daily) or xyzal 10 mg (2 tablets) twice daily - recent lab work showed hypothyroidism, now on synthroid - recent lab work also showed mildly positive alpha-gal-trial of avoidance of mammalian meat for several weeks made no difference in symptoms, okay to reintroduce - Xolair 300 mg sample given today, continue every 4 weeks.  We will submit paperwork for Xolair. You will hear from our biologics coordinator Tammy VonCannon. Please answer her phone calls to ensure a seamless approval process.    Follow up : 3 months, sooner if needed It was a pleasure seeing you again in clinic today! Thank you for allowing me to participate in your care.  Other: sample of xolair given; failed high dose antihistamines  Jonathon Neighbors, MD  [facility] 

## 2024-01-08 ENCOUNTER — Telehealth: Payer: Self-pay | Admitting: Internal Medicine

## 2024-01-08 ENCOUNTER — Other Ambulatory Visit: Payer: Self-pay

## 2024-01-08 MED ORDER — PREDNISONE 20 MG PO TABS: ORAL_TABLET | ORAL | 0 refills | Status: AC

## 2024-01-08 NOTE — Telephone Encounter (Signed)
 Husband called and stated that his wife was seen earlier this week and had an injection, and everything went fine, but that now she is having a really bad hives outbreak with welts bigger than she has ever experienced before. She would like a call back to discuss. Best contact [phone]

## 2024-01-08 NOTE — Telephone Encounter (Signed)
 Called pt to go over symptoms of hives that she's experiencing. Per Dr. Cornel Diesel prednisone  was sent in, pt was advise to take allegra or zyrtec 2 tabs twice daily and if that doesn't improve symptoms than she can take the prednisone  that was sent in to be taken for the next 40 mg 5 days.

## 2024-01-08 NOTE — Telephone Encounter (Signed)
 Thanks Pauline-too soon for Xolair  to be effective. Plan: zyrtec or allegra 2 tablets BID and if no significant improvement start prednisone  40 mg daily x 5 days.

## 2024-01-12 ENCOUNTER — Telehealth: Payer: Self-pay | Admitting: *Deleted

## 2024-01-12 MED ORDER — EPINEPHRINE 0.3 MG/0.3ML IJ SOAJ: 0.3000 mg | INTRAMUSCULAR | 1 refills | Status: AC | PRN

## 2024-01-12 NOTE — Telephone Encounter (Signed)
 Spoke to patient and advised submit and also rx for epipen  to [facility]. Will reach out once delivery set to make appt for next injection in [facility]

## 2024-01-12 NOTE — Telephone Encounter (Signed)
 L/m for patient to contact me to advise approval, copay card and submit to [facility] for XOlair . Will need to get 2 more doses in clinic then can home admin.. Needs epipen 

## 2024-01-12 NOTE — Telephone Encounter (Signed)
-----   Message from Sean Czar sent at 01/06/2024  1:39 PM EDT ----- Can we see about getting her on Xolair  for chronic urticaria?  Sample given in clinic today.

## 2024-01-13 NOTE — Telephone Encounter (Signed)
 Awesome thanks!

## 2024-02-03 ENCOUNTER — Ambulatory Visit

## 2024-02-03 DIAGNOSIS — L501 Idiopathic urticaria: Secondary | ICD-10-CM

## 2024-02-03 MED ORDER — OMALIZUMAB 300 MG/2ML ~~LOC~~ SOAJ
300.0000 mg | SUBCUTANEOUS | Status: AC
  Administered 2024-02-03: 300 mg via SUBCUTANEOUS

## 2024-03-01 ENCOUNTER — Telehealth: Payer: Self-pay

## 2024-03-01 NOTE — Telephone Encounter (Signed)
 Erroneous encounter

## 2024-03-02 ENCOUNTER — Ambulatory Visit

## 2024-04-06 ENCOUNTER — Telehealth: Payer: Self-pay | Admitting: *Deleted

## 2024-04-06 NOTE — Telephone Encounter (Signed)
 Patient called and advised [facility] reaching out to her. I called and l/m for patient to contact me

## 2024-06-10 ENCOUNTER — Telehealth: Payer: Self-pay | Admitting: Internal Medicine

## 2024-06-10 NOTE — Telephone Encounter (Signed)
 Left voicemail to give the office a call back to schedule Dup[facility] reapproval appointment.
